# Patient Record
Sex: Female | Born: 1965 | Race: White | Hispanic: No | Smoking: Current some day smoker
Health system: Southern US, Community
[De-identification: ages and names within clinical notes are randomized; demographics above are authoritative.]

## PROBLEM LIST (undated history)

## (undated) DIAGNOSIS — R51 Headache: Secondary | ICD-10-CM

## (undated) DIAGNOSIS — K5909 Other constipation: Secondary | ICD-10-CM

## (undated) DIAGNOSIS — I10 Essential (primary) hypertension: Secondary | ICD-10-CM

## (undated) DIAGNOSIS — Z8709 Personal history of other diseases of the respiratory system: Secondary | ICD-10-CM

## (undated) DIAGNOSIS — K219 Gastro-esophageal reflux disease without esophagitis: Secondary | ICD-10-CM

## (undated) DIAGNOSIS — F32A Depression, unspecified: Secondary | ICD-10-CM

## (undated) DIAGNOSIS — M549 Dorsalgia, unspecified: Secondary | ICD-10-CM

## (undated) DIAGNOSIS — M2550 Pain in unspecified joint: Secondary | ICD-10-CM

## (undated) DIAGNOSIS — Z860100 Personal history of colon polyps, unspecified: Secondary | ICD-10-CM

## (undated) DIAGNOSIS — E785 Hyperlipidemia, unspecified: Secondary | ICD-10-CM

## (undated) DIAGNOSIS — M2540 Effusion, unspecified joint: Secondary | ICD-10-CM

## (undated) DIAGNOSIS — J45909 Unspecified asthma, uncomplicated: Secondary | ICD-10-CM

## (undated) DIAGNOSIS — J189 Pneumonia, unspecified organism: Secondary | ICD-10-CM

## (undated) DIAGNOSIS — Z9889 Other specified postprocedural states: Secondary | ICD-10-CM

## (undated) DIAGNOSIS — F329 Major depressive disorder, single episode, unspecified: Secondary | ICD-10-CM

## (undated) DIAGNOSIS — R112 Nausea with vomiting, unspecified: Secondary | ICD-10-CM

## (undated) DIAGNOSIS — R350 Frequency of micturition: Secondary | ICD-10-CM

## (undated) DIAGNOSIS — H04123 Dry eye syndrome of bilateral lacrimal glands: Secondary | ICD-10-CM

## (undated) DIAGNOSIS — J439 Emphysema, unspecified: Secondary | ICD-10-CM

## (undated) DIAGNOSIS — K429 Umbilical hernia without obstruction or gangrene: Secondary | ICD-10-CM

## (undated) DIAGNOSIS — J449 Chronic obstructive pulmonary disease, unspecified: Secondary | ICD-10-CM

## (undated) DIAGNOSIS — F419 Anxiety disorder, unspecified: Secondary | ICD-10-CM

## (undated) DIAGNOSIS — M1990 Unspecified osteoarthritis, unspecified site: Secondary | ICD-10-CM

## (undated) DIAGNOSIS — E039 Hypothyroidism, unspecified: Secondary | ICD-10-CM

## (undated) DIAGNOSIS — G8929 Other chronic pain: Secondary | ICD-10-CM

## (undated) DIAGNOSIS — R0602 Shortness of breath: Secondary | ICD-10-CM

## (undated) DIAGNOSIS — R3915 Urgency of urination: Secondary | ICD-10-CM

## (undated) DIAGNOSIS — H269 Unspecified cataract: Secondary | ICD-10-CM

## (undated) DIAGNOSIS — K5190 Ulcerative colitis, unspecified, without complications: Secondary | ICD-10-CM

## (undated) HISTORY — PX: ESOPHAGOGASTRODUODENOSCOPY: SHX1529

## (undated) HISTORY — PX: CHOLECYSTECTOMY: SHX55

## (undated) HISTORY — PX: BACK SURGERY: SHX140

## (undated) HISTORY — PX: TUBAL LIGATION: SHX77

## (undated) HISTORY — PX: COLONOSCOPY: SHX174

## (undated) HISTORY — PX: HERNIA REPAIR: SHX51

## (undated) HISTORY — PX: NECK SURGERY: SHX720

## (undated) HISTORY — PX: CARPAL TUNNEL RELEASE: SHX101

## (undated) HISTORY — PX: ABDOMINAL HYSTERECTOMY: SHX81

---

## 1998-09-04 ENCOUNTER — Other Ambulatory Visit: Admission: RE | Admit: 1998-09-04 | Discharge: 1998-09-04 | Payer: Self-pay | Admitting: Family Medicine

## 1999-07-08 ENCOUNTER — Other Ambulatory Visit: Admission: RE | Admit: 1999-07-08 | Discharge: 1999-07-08 | Payer: Self-pay | Admitting: Family Medicine

## 2002-06-21 ENCOUNTER — Encounter: Payer: Self-pay | Admitting: Neurosurgery

## 2002-06-26 ENCOUNTER — Ambulatory Visit (HOSPITAL_COMMUNITY): Admission: RE | Admit: 2002-06-26 | Discharge: 2002-06-27 | Payer: Self-pay | Admitting: Neurosurgery

## 2002-06-26 ENCOUNTER — Encounter: Payer: Self-pay | Admitting: Neurosurgery

## 2002-09-12 ENCOUNTER — Encounter: Admission: RE | Admit: 2002-09-12 | Discharge: 2002-09-12 | Payer: Self-pay | Admitting: Neurosurgery

## 2002-09-12 ENCOUNTER — Encounter: Payer: Self-pay | Admitting: Neurosurgery

## 2004-03-10 ENCOUNTER — Ambulatory Visit (HOSPITAL_COMMUNITY): Admission: RE | Admit: 2004-03-10 | Discharge: 2004-03-11 | Payer: Self-pay | Admitting: Neurosurgery

## 2004-03-17 ENCOUNTER — Emergency Department (HOSPITAL_COMMUNITY): Admission: EM | Admit: 2004-03-17 | Discharge: 2004-03-17 | Payer: Self-pay | Admitting: Emergency Medicine

## 2004-08-07 ENCOUNTER — Encounter: Admission: RE | Admit: 2004-08-07 | Discharge: 2004-08-07 | Payer: Self-pay | Admitting: Neurosurgery

## 2004-08-22 ENCOUNTER — Encounter: Admission: RE | Admit: 2004-08-22 | Discharge: 2004-08-22 | Payer: Self-pay | Admitting: Neurosurgery

## 2004-09-09 ENCOUNTER — Encounter: Admission: RE | Admit: 2004-09-09 | Discharge: 2004-09-09 | Payer: Self-pay | Admitting: Neurosurgery

## 2004-09-30 ENCOUNTER — Encounter: Admission: RE | Admit: 2004-09-30 | Discharge: 2004-09-30 | Payer: Self-pay | Admitting: Neurosurgery

## 2004-11-19 ENCOUNTER — Encounter: Admission: RE | Admit: 2004-11-19 | Discharge: 2004-11-19 | Payer: Self-pay | Admitting: Neurosurgery

## 2004-12-01 ENCOUNTER — Ambulatory Visit (HOSPITAL_COMMUNITY): Admission: RE | Admit: 2004-12-01 | Discharge: 2004-12-02 | Payer: Self-pay | Admitting: Neurosurgery

## 2007-10-18 ENCOUNTER — Encounter: Admission: RE | Admit: 2007-10-18 | Discharge: 2007-10-18 | Payer: Self-pay | Admitting: Neurosurgery

## 2008-06-26 ENCOUNTER — Inpatient Hospital Stay (HOSPITAL_COMMUNITY): Admission: RE | Admit: 2008-06-26 | Discharge: 2008-07-01 | Payer: Self-pay | Admitting: Neurosurgery

## 2008-08-09 ENCOUNTER — Encounter: Admission: RE | Admit: 2008-08-09 | Discharge: 2008-08-09 | Payer: Self-pay | Admitting: Neurosurgery

## 2008-11-08 ENCOUNTER — Encounter: Admission: RE | Admit: 2008-11-08 | Discharge: 2008-11-08 | Payer: Self-pay | Admitting: Neurosurgery

## 2010-07-02 LAB — COMPREHENSIVE METABOLIC PANEL
ALT: 13 U/L (ref 0–35)
AST: 20 U/L (ref 0–37)
Albumin: 3.4 g/dL — ABNORMAL LOW (ref 3.5–5.2)
Alkaline Phosphatase: 91 U/L (ref 39–117)
BUN: 9 mg/dL (ref 6–23)
CO2: 25 mEq/L (ref 19–32)
Calcium: 8.9 mg/dL (ref 8.4–10.5)
Chloride: 108 mEq/L (ref 96–112)
Creatinine, Ser: 0.74 mg/dL (ref 0.4–1.2)
GFR calc Af Amer: >60 mL/min (ref >60)
GFR calc non Af Amer: >60 mL/min (ref >60)
Glucose, Bld: 84 mg/dL (ref 70–99)
Potassium: 4.3 mEq/L (ref 3.5–5.1)
Sodium: 138 mEq/L (ref 135–145)
Total Bilirubin: 0.9 mg/dL (ref 0.3–1.2)
Total Protein: 5.6 g/dL — ABNORMAL LOW (ref 6.0–8.3)

## 2010-07-02 LAB — CBC
HCT: 38 % (ref 36.0–46.0)
Hemoglobin: 13 g/dL (ref 12.0–15.0)
MCHC: 34.2 g/dL (ref 30.0–36.0)
MCV: 93.1 fL (ref 78.0–100.0)
Platelets: 186 10*3/uL (ref 150–400)
RBC: 4.08 MIL/uL (ref 3.87–5.11)
RDW: 13.5 % (ref 11.5–15.5)
WBC: 8.8 10*3/uL (ref 4.0–10.5)

## 2010-07-02 LAB — DIFFERENTIAL
Lymphocytes Relative: 15 % (ref 12–46)
Lymphs Abs: 1.3 10*3/uL (ref 0.7–4.0)
Monocytes Absolute: 0.4 10*3/uL (ref 0.1–1.0)
Monocytes Relative: 5 % (ref 3–12)
Neutro Abs: 7 10*3/uL (ref 1.7–7.7)
Neutrophils Relative %: 79 % — ABNORMAL HIGH (ref 43–77)

## 2010-07-02 LAB — TYPE AND SCREEN: Antibody Screen: POSITIVE

## 2010-08-05 NOTE — Op Note (Signed)
[name], [name]               ACCOUNT NO.:  [id number]   MEDICAL RECORD NO.:  [id number]          PATIENT TYPE:  INP   LOCATION:  3005                         FACILITY:  [facility]   PHYSICIAN:  Kathaleen Maser. Pool, M.D.    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   POSTOPERATIVE DIAGNOSIS:  L5-S1 degenerative disk  disease/postlaminectomy spondylolisthesis.   PROCEDURE NAME:  Reexploration of L5-S1 laminotomy with bilateral redo  L5-S1 decompressive laminectomies and foraminotomies, more than would be  required for simple interbody fusion alone.  L5-S1 posterior lumbar  interbody fusion utilizing tangent interbody allograft wedge, Telamon  interbody PEEK cage and local autografting.  L5-S1 posterolateral  arthrodesis utilizing non-segmental pedicle screw fixation and local  autografting.   SURGEON:  Kathaleen Maser. Pool, MD   ASSISTANT:  Reinaldo Meeker, MD   ANESTHESIA:  General endotracheal.   INDICATIONS:  [name] is a 45 year old female with history of  previous left-sided L5-S1 laminotomy and diskectomy x2.  The patient  presents with intractable back pain and lower extremity symptoms failing  conservative management.  Workup demonstrates evidence of  postlaminectomy spondylolisthesis with marked disk space collapse at L5-  S1.  She has severe foraminal stenosis.  The patient has been counseled  as to her options.  She decided to proceed with L5-S1 decompression and  fusion with instrumentation in the hopes of improving her symptoms.   OPERATIVE NOTE:  The patient was brought to the operative room and  placed on operating table in the supine position.  After adequate level  of anesthesia was achieved, the patient was positioned prone onto a  Wilson frame, appropriately padded the patient's lumbar  regions, prepped  and draped sterilely.  A #10 blade was used to make a curvilinear skin  incision overlying the L4-5 and S1 levels.  This was carried sharply in  the midline.  A subperiosteal dissection was then performed exposing the  lamina and facet joints of L4-5 and S1 bilaterally as well as transverse  processes of L5 and sacral ala bilaterally.  Deep self-retaining  retractor was placed.  Intraoperative fluoroscopy was used and levels  were confirmed.  Previous laminotomy site on the left-sided L5-S1 was  dissected free and epidural scar was resected.  Complete laminectomy  then performed at L5 using Leksell rongeurs and Kerrison rongeurs to  remove the entire lamina of L5.  Inferior facetectomies of L5 were  performed bilaterally.  Superior facetectomies of S1 were performed  bilaterally.  Superior laminectomy of S1 was performed bilaterally.  All  bone was cleaned and used in the later autografting.  Epidural scar and  ligament flavum were elevated resected in the piecemeal fashion.  Wide  decompressive foraminotomies were performed along the course of exiting  L5 and S1 nerve roots bilaterally.  Epidural venous plexus was  coagulated and cut.  Bilateral diskectomies were then performed at L5-  S1.  Disk space was then distracted up to 8  mm, then an 8-mm distractor  left on the patient's right side.  Thecal sac and nerve root were  inspected on the left side.  Disk space was then reamed and cut with 8-  mm tangent instrument.  Soft tissue was removed from the interspace.  An  8 x 26 mm tangent wedge was then packed into place and recessed  approximately 2 mm from the posterior cortical margin.  Distractors were  removed from the patient's right side.  Thecal sac and nerve root were  inspected on the right side.  Disk space once again was reamed and then  cut with 8-mm tangent instrument.  Soft tissues were then further  removed from the interspace.  Disk space was curettaged.   Morselized  autograft was packed for later fusion.  An 8 x 22 mm Telamon cage was  packed with morselized autograft, then packed into place and recessed  approximately 2 mm from the posterior cortical margin of L5.  Pedicles  of L5 and S1 were then identified using surface landmarks and  intraoperative fluoroscopy.  Superficial bone overlying the pedicle was  then removed using the high-speed drill.  Each pedicle was then probed  using pedicle awl.  The pedicle awl track was then tapped with a 5.25-mm  screw.  Each screw tap hole was then probed and found to be solid with  the bone.  A 5.75 x 45 mm radius screw was placed bilaterally at L5.  A  6.75 x 40 mm screw was placed bilaterally at S1.  Transverse processes  and sacral ala were then decorticated using high speed drill.  Morselized autograft was packed inferolaterally for later fusion.  Short  segment titanium rods were then placed over the screw heads at L5 and  S1.  Locking caps were placed over the screw head and locking caps were  then engaged with construct under pressure.  Final images revealed good  position of bone grafts, hardware at appropriate operative level, and  normal spine.  Wound was then irrigated with antibiotic solution.  Gelfoam was placed topically for hemostasis and found to be good.  A  medium Hemovac drain was left in the interspace.  The wounds were then  closed in layers with Vicryl suture.  Steri-Strips and sterile dressing  were applied.  There were no complications.  The patient tolerated the  procedure well and she returned to the recovery room postoperatively.           ______________________________  Kathaleen Maser Pool, M.D.     HAP/MEDQ  D:  06/26/2008  T:  06/27/2008  Job:  [id number]

## 2010-08-05 NOTE — Discharge Summary (Signed)
[name], [name]               ACCOUNT NO.:  [id number]   MEDICAL RECORD NO.:  [id number]          PATIENT TYPE:  INP   LOCATION:  3005                         FACILITY:  [facility]   PHYSICIAN:  Kathaleen Maser. Pool, M.D.    DATE OF BIRTH:  12/04/1965   DATE OF ADMISSION:  06/26/2008  DATE OF DISCHARGE:  07/01/2008                               DISCHARGE SUMMARY   FINAL DIAGNOSIS:  L5-S1 degenerative disk disease with stenosis.   HISTORY OF PRESENT ILLNESS:  [name] is a 45 year old female, status  post previous L5-S1 laminectomy and diskectomy x2.  The patient presents  now with symptoms of lumbar instability and chronic pain.  The patient  presents now for lumbar decompression and fusion at the L5-S1 level in  hopes of improving her symptoms.   HOSPITAL COURSE:  The patient was taken to the operating room and  uncomplicated L5-S1 decompression and fusion with instrumentation was  performed.  Postoperatively, the patient had a great deal of difficulty  with incisional discomfort.  Preoperative radicular pain was improved.  She was gradually mobilized with physical and occupational therapy.  Her  overall pain level gradually diminished.  At the time of discharge, the  patient was tolerating a regular diet.  She is up and ambulatory with  minimal assistance.  Her wound was healing well.  Plan is for her to be  discharged home.  Discharge medications are unchanged from preop.   DISCHARGE DISPOSITION:  The patient will follow up in my office in 1  week.           ______________________________  Kathaleen Maser. Pool, M.D.     HAP/MEDQ  D:  08/07/2008  T:  08/08/2008  Job:  [id number]

## 2010-08-08 NOTE — Op Note (Signed)
NAMEQUETZALLY, [name]               ACCOUNT NO.:  [id number]   MEDICAL RECORD NO.:  [id number]          PATIENT TYPE:  AMB   LOCATION:  SDS                          FACILITY:  [facility]   PHYSICIAN:  Henry A. Pool, M.D.    DATE OF BIRTH:  03-Aug-1965   DATE OF PROCEDURE:  12/01/2004  DATE OF DISCHARGE:                                 OPERATIVE REPORT   PREOPERATIVE DIAGNOSES:  Left L5-S1 recurrent herniated nucleus pulposus and  radiculopathy.   POSTOPERATIVE DIAGNOSES:  Left L5-S1 recurrent herniated nucleus pulposus  and radiculopathy.   OPERATION PERFORMED:  Left L5-S1 redo laminotomy and microdiskectomy.   SURGEON:  Kathaleen Maser. Pool, M.D.   ASSISTANT:  Donalee Citrin, M.D.   ANESTHESIA:  General endotracheal.   INDICATIONS FOR PROCEDURE:  [name] is a 45 year old female status post  previous left-sided L5-S1 laminotomy and microdiskectomy.  The patient  presents now with recurrent left S1 radicular pain failing conservative  management.  Work-up demonstrates evidence of a probable left L5-S1  recurrent disk herniation with compression of the left-sided S1 nerve root.   DESCRIPTION OF PROCEDURE:  The patient was taken to the operating room and  placed on the table in the supine position.  After adequate level of  anesthesia was achieved, the patient was positioned prone onto a Wilson  frame and appropriately padded.  The patient's lumbar region was prepped and  draped sterilely.  A 10 blade was used to make a linear skin incision  overlying the L5-S1 interspace.  This was carried down sharply in the  midline.  A subperiosteal dissection was then performed exposing the lamina  and facet joints of L5 and S1 on the left.  Deep self-retaining retractor  was placed.  Intraoperative x-ray was taken, the level was confirmed.  The  patient's previous laminotomy site was then dissected free using dental  instruments.  The laminotomy was widened slightly using Kerrison rongeurs.  Epidural  scar was dissected free.  The left S1 nerve root was identified  just medial to the S1 pedicle.  Microscope was brought into the field and  used for microdissection of the left-sided S1 nerve root and disk  herniation.  The S1 nerve root was then mobilized toward the midline.  The  disk herniation was readily apparent.  It was then dissected free using  dental instruments.  It was then removed in piecemeal fashion using  pituitary rongeurs.  The disk space was then entered and loose fragments of  disk material were then removed from the interspace.  At this point a very  thorough diskectomy had been achieved.  There was no evidence of injury to  thecal sac or nerve roots. There was no evidence of any residual compression  to the thecal sac or nerve roots.  The wound was then irrigated with  antibiotic solution.  Gelfoam was placed topically for hemostasis which was  found to be good.  Microscope and retractor system were removed.  Hemostasis was achieved with  electrocautery.  The wound was then closed in layers with Vicryl sutures.  Steri-Strips and sterile dressing  were applied.  There were no apparent  complications.  The patient tolerated the procedure well and returned to the  recovery room postoperatively.           ______________________________  Kathaleen Maser Pool, M.D.     HAP/MEDQ  D:  12/01/2004  T:  12/02/2004  Job:  [id number]

## 2010-08-08 NOTE — Op Note (Signed)
NAME:  [name], [name]                         ACCOUNT NO.:  [id number]   MEDICAL RECORD NO.:  [id number]                   PATIENT TYPE:  OIB   LOCATION:  3008                                 FACILITY:  [facility]   PHYSICIAN:  Kathaleen Maser. Pool, M.D.                 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   OPERATION PERFORMED:  C5-6 anterior cervical diskectomy with allograft and  anterior plating.   SURGEON:  Kathaleen Maser. Pool, M.D.   ASSISTANT:  Reinaldo Meeker, M.D.   ANESTHESIA:  General endotracheal.   INDICATIONS FOR PROCEDURE:  The patient is a 45 year old female with history  of neck and right upper extremity symptoms, consistent with a right-sided C6  radiculopathy which has failed conservative management.  MRI scan  demonstrates evidence of very significant spondylosis off to the right side  at C5-6 with marked compression of the right-sided C6 nerve root at its  enters the neural foramen.  The patient has been counseled as to her  options.  She decided to proceed with a C5-6 anterior cervical diskectomy  and fusion with allograft and anterior plating for hopeful improvement of  her symptoms.   DESCRIPTION OF PROCEDURE:  The patient was taken to the operating room and  placed on the table in supine position.  After adequate level of general  anesthesia was achieved, the patient was positioned with the neck slightly  extended and held in place with halter traction.  The patient's anterior  cervical region was prepped and draped sterilely.  A 10 blade was used to  make a linear skin incision overlying the C5-6 interspace.  This was carried  down sharply to the platysma.  The platysma was then divided vertically and  dissection proceeded along the  medial border of the sternocleidomastoid  muscle and carotid sheath.  Trachea and esophagus were mobilized and  retracted toward the left.  Prevertebral fascia was stripped off the  anterior spinal column.  The longus colli muscles were then elevated  bilaterally using electrocautery.  Deep self-retaining retractor was placed.  Intraoperative fluoroscopy was used and the C5-6 level were confirmed.  The  disk spaces was then incised with a 15 blade in rectangular fashion.  A wide  disk space clean out was then achieved using pituitary rongeurs, forward and  backward angled Carlens curets, Kerrison rongeurs and a high speed drill.  All elements of the disk were removed down to the posterior annulus at both  levels.  Microscope was brought into the field and used throughout the  remainder of the diskectomy.  Remaining aspects of the annulus and  osteophytes were removed using high speed drill down to the level of  the  posterior longitudinal ligament.  The posterior longitudinal ligament was  then elevated and resected in piecemeal fashion using Kerrison rongeurs.  The underlying thecal sac was identified.  A wide central decompression was  then performed by using Kerrison rongeurs to undercut the bodies of C5 and  C6.  Decompression proceeded out to the left-sided C6 foramen.  C6 nerve  root was identified proximally.  There was no evidence of any compression.  Decompression then proceeded out to the right-sided C6 foramen.  The C6  nerve root was once again identified proximally.  A wide anterior  foraminotomy was then performed using Kerrison rongeurs along the course of  the exiting nerve root.  There was no residual stenosis remaining.  A blunt  probe passed easily along the course of the nerve root.  There was no  evidence of injury to the thecal sac or nerve roots.  The wound was then  irrigated with antibiotic solution.  Gelfoam was placed topically for  hemostasis.  A 6 mm patellar  wedge allograft was then impacted into place  and stretched approximately 1 mm from the anterior cortical surface.  A 23  mm Atlantis anterior cervical plate was then placed over the C5 and C6  levels.  This was then attached under fluoroscopic guidance using 13 mm  variable screws, two each at C5 and C6.  All four screws given a final  tightening and found to be solidly within bone.  Locking screws engaged at  both levels.  Final images revealed good position of bone grafts and  hardware at the proper operative level with normal alignment of the spine.  The wound was then irrigated with antibiotic solution.  Inspected for  hemostasis which was found to be good.  The wound was then closed in typical  fashion.  Steri-Strips and sterile dressing were applied.  There were no  apparent complications.  The patient tolerated the procedure well and  returned to the recovery room postoperatively.                                                 Henry A. Pool, M.D.    HAP/MEDQ  D:  06/26/2002  T:  06/26/2002  Job:  [id number]

## 2010-08-08 NOTE — Op Note (Signed)
NAMEKANIKA, BUNGERT               ACCOUNT NO.:  [id number]   MEDICAL RECORD NO.:  [id number]          PATIENT TYPE:  OIB   LOCATION:  2899                         FACILITY:  [facility]   PHYSICIAN:  Kathaleen Maser. Pool, M.D.    DATE OF BIRTH:  November 28, 1965   DATE OF PROCEDURE:  03/10/2004  DATE OF DISCHARGE:                                 OPERATIVE REPORT   SURGEON:  Sherilyn Cooter A. Pool, M.D.   ASSISTANT:  Nurse.   PREOPERATIVE DIAGNOSIS:  Left L5-S1 herniated nucleus pulposus with  radiculopathy.   POSTOPERATIVE DIAGNOSIS:  Left L5-S1 herniated nucleus pulposus with  radiculopathy.   PROCEDURE:  Left L5-S1 laminectomy and microdiskectomy.   SURGEON:  Kathaleen Maser. Pool, M.D.   ASSISTANT:  Izell Henagar. Elesa Hacker, M.D.   ANESTHESIA:  General endotracheal.   INDICATIONS:  Ms. Gin is a 45 year old female with a history of back and  left lower extremity pain, paresthesias and weakness with left-sided L5-S1  radiculopathy.  Patient has failed conservative management.  Workup and x-  rays show evidence of an left-sided L5-S1 disk herniation with a superior  fragment causing compression in the left-sided L5-S1 nerve roots.  Patient  presents now for laminectomy and microdiskectomy in hopes of improving her  symptoms.   OPERATIVE NOTE:  Patient was brought to the operating room and placed in a  supine position.  The appropriate level of anesthesia was achieved.  Patient  was turned prone onto the Wilson frame for better __________.  Prepped and  draped sterilely.  A 10 blade __________ incision overlying the L5-S1  interspace.  This carried down sharply in the midline.  A subperiosteal  dissection had been performed on the left side exposing the lamina and facet  joints of L5-S1.  A deep self-retaining retractor was placed.  X-ray was  taken.  The level was confirmed.  The laminectomy was then performed using a  high-speed drill and Kerrison rongeurs at the inferior aspect of the lamina  of L5 and the  medial aspect of the L5-S1 facet joint and superior rim of the  S1 lam.  The ligamentum flavum was then elevated and resected in same  fashion using Kerrison rongeurs around the thecal sac.  Next, the L5-S1  nerve roots were identified.  The microscope was brought into these for  microdiskectomy of the afore-mentioned nerve root with underlying disk  herniation.  Epidural area around this venous plexus coagulated and cut.  Thecal sac and S1 nerve root were mobilized, turned towards the midline.  Disk space and superior disk herniation were apparent.  These were then  incised with a 15 blade, __________ were fractured to avoid disc space.  This was shaped using pituitary rongeurs, operating pituitary rongeurs and  Epstein curettes.  All of the disk herniation was completely resected,  including the superior free fragment.  All loose or the sharp disk material  was removed from the interspace after a very thorough diskectomy had been  performed.  The spinal canal was inspected.  It was found to be free of any  residual compression.  The wound is  then irrigated, __________.  Gelfoam is  placed postoperatively for hemostasis,  found to be good in the operating microscope.  Retractors were removed.  Hemostasis was achieved with electrocautery.  The wound was closed in layers  with Vicryl sutures.  Steri-Strip and sterile dressings were applied.  There  were no known complications.  Patient was awakened and she returned  __________.      HAP/MEDQ  D:  03/10/2004  T:  03/10/2004  Job:  [id number]

## 2011-07-16 ENCOUNTER — Encounter: Payer: Self-pay | Admitting: Physical Medicine & Rehabilitation

## 2011-08-10 ENCOUNTER — Ambulatory Visit: Payer: Medicare Other | Admitting: Physical Medicine & Rehabilitation

## 2011-08-10 ENCOUNTER — Encounter: Payer: Medicare Other | Attending: Physical Medicine & Rehabilitation

## 2011-09-09 ENCOUNTER — Other Ambulatory Visit: Payer: Self-pay | Admitting: Neurosurgery

## 2011-09-09 DIAGNOSIS — M549 Dorsalgia, unspecified: Secondary | ICD-10-CM

## 2011-09-22 ENCOUNTER — Ambulatory Visit
Admission: RE | Admit: 2011-09-22 | Discharge: 2011-09-22 | Disposition: A | Discharge status: Home / Self Care | Payer: Medicare Other | Source: Ambulatory Visit | Attending: Neurosurgery | Admitting: Neurosurgery

## 2011-09-22 VITALS — BP 118/53 | HR 57 | Ht 65.0 in | Wt 140.0 lb

## 2011-09-22 DIAGNOSIS — M549 Dorsalgia, unspecified: Secondary | ICD-10-CM

## 2011-09-22 MED ORDER — DIAZEPAM 5 MG PO TABS
10.0000 mg | ORAL_TABLET | Freq: Once | ORAL | Status: AC
  Administered 2011-09-22: 10 mg via ORAL

## 2011-09-22 MED ORDER — ONDANSETRON HCL 4 MG/2ML IJ SOLN: 4.0000 mg | Freq: Four times a day (QID) | INTRAMUSCULAR | Status: DC | PRN

## 2011-09-22 MED ORDER — MEPERIDINE HCL 100 MG/ML IJ SOLN
75.0000 mg | Freq: Once | INTRAMUSCULAR | Status: AC
  Administered 2011-09-22: 75 mg via INTRAMUSCULAR

## 2011-09-22 MED ORDER — IOHEXOL 180 MG/ML  SOLN
15.0000 mL | Freq: Once | INTRAMUSCULAR | Status: AC | PRN
  Administered 2011-09-22: 15 mL via INTRATHECAL

## 2011-09-22 MED ORDER — ONDANSETRON HCL 4 MG/2ML IJ SOLN
4.0000 mg | Freq: Once | INTRAMUSCULAR | Status: AC
  Administered 2011-09-22: 4 mg via INTRAMUSCULAR

## 2011-09-22 MED ORDER — OXYCODONE-ACETAMINOPHEN 5-325 MG PO TABS
2.0000 | ORAL_TABLET | Freq: Once | ORAL | Status: AC
  Administered 2011-09-22: 2 via ORAL

## 2011-09-22 NOTE — Discharge Instructions (Signed)

## 2011-10-15 ENCOUNTER — Other Ambulatory Visit: Payer: Self-pay | Admitting: Neurosurgery

## 2011-10-15 ENCOUNTER — Encounter (HOSPITAL_COMMUNITY): Payer: Self-pay | Admitting: Pharmacy Technician

## 2011-10-27 ENCOUNTER — Encounter (HOSPITAL_COMMUNITY): Payer: Self-pay | Admitting: Pharmacy Technician

## 2011-10-27 ENCOUNTER — Encounter (HOSPITAL_COMMUNITY): Payer: Self-pay

## 2011-10-27 ENCOUNTER — Encounter (HOSPITAL_COMMUNITY)
Admission: RE | Admit: 2011-10-27 | Discharge: 2011-10-27 | Disposition: A | Discharge status: Home / Self Care | Payer: BC Managed Care – PPO | Source: Ambulatory Visit | Attending: Neurosurgery | Admitting: Neurosurgery

## 2011-10-27 DIAGNOSIS — Z01818 Encounter for other preprocedural examination: Secondary | ICD-10-CM | POA: Insufficient documentation

## 2011-10-27 DIAGNOSIS — Z01812 Encounter for preprocedural laboratory examination: Secondary | ICD-10-CM | POA: Insufficient documentation

## 2011-10-27 DIAGNOSIS — Z01810 Encounter for preprocedural cardiovascular examination: Secondary | ICD-10-CM | POA: Insufficient documentation

## 2011-10-27 HISTORY — DX: Effusion, unspecified joint: M25.40

## 2011-10-27 HISTORY — DX: Unspecified cataract: H26.9

## 2011-10-27 HISTORY — DX: Urgency of urination: R39.15

## 2011-10-27 HISTORY — DX: Essential (primary) hypertension: I10

## 2011-10-27 HISTORY — DX: Headache: R51

## 2011-10-27 HISTORY — DX: Chronic obstructive pulmonary disease, unspecified: J44.9

## 2011-10-27 HISTORY — DX: Personal history of colonic polyps: Z86.010

## 2011-10-27 HISTORY — DX: Unspecified osteoarthritis, unspecified site: M19.90

## 2011-10-27 HISTORY — DX: Pain in unspecified joint: M25.50

## 2011-10-27 HISTORY — DX: Other chronic pain: G89.29

## 2011-10-27 HISTORY — DX: Hyperlipidemia, unspecified: E78.5

## 2011-10-27 HISTORY — DX: Other specified postprocedural states: Z98.890

## 2011-10-27 HISTORY — DX: Ulcerative colitis, unspecified, without complications: K51.90

## 2011-10-27 HISTORY — DX: Dorsalgia, unspecified: M54.9

## 2011-10-27 HISTORY — DX: Emphysema, unspecified: J43.9

## 2011-10-27 HISTORY — DX: Depression, unspecified: F32.A

## 2011-10-27 HISTORY — DX: Hypothyroidism, unspecified: E03.9

## 2011-10-27 HISTORY — DX: Pneumonia, unspecified organism: J18.9

## 2011-10-27 HISTORY — DX: Major depressive disorder, single episode, unspecified: F32.9

## 2011-10-27 HISTORY — DX: Shortness of breath: R06.02

## 2011-10-27 HISTORY — DX: Anxiety disorder, unspecified: F41.9

## 2011-10-27 HISTORY — DX: Personal history of other diseases of the respiratory system: Z87.09

## 2011-10-27 HISTORY — DX: Frequency of micturition: R35.0

## 2011-10-27 HISTORY — DX: Dry eye syndrome of bilateral lacrimal glands: H04.123

## 2011-10-27 HISTORY — DX: Other specified postprocedural states: R11.2

## 2011-10-27 HISTORY — DX: Gastro-esophageal reflux disease without esophagitis: K21.9

## 2011-10-27 HISTORY — DX: Other constipation: K59.09

## 2011-10-27 HISTORY — DX: Personal history of colon polyps, unspecified: Z86.0100

## 2011-10-27 HISTORY — DX: Unspecified asthma, uncomplicated: J45.909

## 2011-10-27 LAB — SURGICAL PCR SCREEN: MRSA, PCR: NEGATIVE

## 2011-10-27 LAB — CBC WITH DIFFERENTIAL/PLATELET
Basophils Relative: 1 % (ref 0–1)
HCT: 38.3 % (ref 36.0–46.0)
Hemoglobin: 12.6 g/dL (ref 12.0–15.0)
Lymphs Abs: 1.9 10*3/uL (ref 0.7–4.0)
MCH: 30.7 pg (ref 26.0–34.0)
MCHC: 32.9 g/dL (ref 30.0–36.0)
Monocytes Absolute: 0.4 10*3/uL (ref 0.1–1.0)
Monocytes Relative: 7 % (ref 3–12)
Neutro Abs: 3.5 10*3/uL (ref 1.7–7.7)
Neutrophils Relative %: 58 % (ref 43–77)
RBC: 4.11 MIL/uL (ref 3.87–5.11)

## 2011-10-27 LAB — BASIC METABOLIC PANEL
CO2: 26 mEq/L (ref 19–32)
Calcium: 8.9 mg/dL (ref 8.4–10.5)
Chloride: 107 mEq/L (ref 96–112)
Creatinine, Ser: 0.89 mg/dL (ref 0.50–1.10)
Glucose, Bld: 81 mg/dL (ref 70–99)

## 2011-10-27 NOTE — Pre-Procedure Instructions (Signed)
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  Call this number if you have problems the morning of surgery: [phone]   Remember:   Do not eat food:After Midnight.  Take these medicines the morning of surgery with A SIP OF WATER: Pain Pill(if needed),Metoprolol(Lopressor),Eye Drops(if needed),Synthroid((Levothyroxine),Esomeprazole(Nexium),Diazepam(Valium),and Albuterol(if needed)<Bring Your Inhaler With You> ,  Do not wear jewelry, make-up or nail polish.  Do not wear lotions, powders, or perfumes.   Do not bring valuables to the hospital.  Contacts, dentures or bridgework may not be worn into surgery.  Leave suitcase in the car. After surgery it may be brought to your room.  For patients admitted to the hospital, checkout time is 11:00 AM the day of discharge.   Patients discharged the day of surgery will not be allowed to drive home.   Special Instructions: CHG Shower Use Special Wash: 1/2 bottle night before surgery and 1/2 bottle morning of surgery.   Please read over the following fact sheets that you were given: Pain Booklet, Coughing and Deep Breathing, Blood Transfusion Information, MRSA Information and Surgical Site Infection Prevention

## 2011-10-27 NOTE — Progress Notes (Signed)
Called and left message for Erie Noe that blood bank called and pt has antibodies-could Dr.Pool put an order in to have 2 units available for surgery

## 2011-10-27 NOTE — Progress Notes (Signed)
Pt doesn't have a  Cardiologist  Denies ever having an echo/stress test/heart cath  Pt goes to a clinic in [place] and sees a PA Joyice Faster  Denies ekg/cxr <1

## 2011-10-28 ENCOUNTER — Encounter (HOSPITAL_COMMUNITY): Payer: Self-pay | Admitting: Vascular Surgery

## 2011-10-28 NOTE — Consult Note (Signed)
Anesthesia Chart Review:  Patient is a 46 year old female scheduled for L4-5 degenerative disc disease, PLIF on 10/30/11 by Dr. Jordan Likes.  History includes smoking, post-operative N/V, HTN, COPD/asthma/bronchitis/emphysema, PNA, headaches, ulcerative colitis, cataracts, HLD, GERD, hypothyroidism, anxiety, depression, prior back and neck surgery.     Labs noted.  PLT 142.  Glucose, Cr, H/H WNL.  T&C done.  CXR on 10/27/11 no acute cardiopulmonary disease.  EKG on 10/27/11 showed SB @ 51 bpm, low voltage QRS, cannot rule out anterior infarct (age undetermined), non-specific ST/T wave changes.  Overall, I think her EKG is stable since at least December 2005.    Anticipate she can proceed if no acute cardiopulmonary symptoms.  Shonna Chock, PA-C

## 2011-10-29 MED ORDER — CEFAZOLIN SODIUM-DEXTROSE 2-3 GM-% IV SOLR: 2.0000 g | INTRAVENOUS | Status: DC

## 2011-10-29 MED ORDER — DEXAMETHASONE SODIUM PHOSPHATE 10 MG/ML IJ SOLN: 10.0000 mg | INTRAMUSCULAR | Status: DC

## 2011-10-30 ENCOUNTER — Inpatient Hospital Stay (HOSPITAL_COMMUNITY): Admission: RE | Admit: 2011-10-30 | Payer: Medicare Other | Source: Ambulatory Visit | Admitting: Neurosurgery

## 2011-10-30 ENCOUNTER — Encounter (HOSPITAL_COMMUNITY): Admission: RE | Payer: Self-pay | Source: Ambulatory Visit

## 2011-10-30 SURGERY — POSTERIOR LUMBAR FUSION 1 LEVEL
Anesthesia: General | Site: Back

## 2011-11-01 LAB — TYPE AND SCREEN
Antibody Screen: POSITIVE
DAT, IgG: NEGATIVE
Donor AG Type: NEGATIVE
Unit division: 0

## 2011-11-10 ENCOUNTER — Other Ambulatory Visit: Payer: Self-pay | Admitting: Neurosurgery

## 2011-11-10 DIAGNOSIS — M545 Low back pain: Secondary | ICD-10-CM

## 2011-11-13 ENCOUNTER — Ambulatory Visit
Admission: RE | Admit: 2011-11-13 | Discharge: 2011-11-13 | Disposition: A | Discharge status: Home / Self Care | Payer: Medicare Other | Source: Ambulatory Visit | Attending: Neurosurgery | Admitting: Neurosurgery

## 2011-11-13 DIAGNOSIS — M545 Low back pain: Secondary | ICD-10-CM

## 2011-11-13 MED ORDER — METHYLPREDNISOLONE ACETATE 40 MG/ML INJ SUSP (RADIOLOG
120.0000 mg | Freq: Once | INTRAMUSCULAR | Status: AC
  Administered 2011-11-13: 120 mg via EPIDURAL

## 2011-11-13 MED ORDER — IOHEXOL 180 MG/ML  SOLN
1.0000 mL | Freq: Once | INTRAMUSCULAR | Status: AC | PRN
  Administered 2011-11-13: 1 mL via EPIDURAL

## 2012-04-13 ENCOUNTER — Other Ambulatory Visit: Payer: Self-pay | Admitting: Neurosurgery

## 2012-04-18 ENCOUNTER — Encounter (HOSPITAL_COMMUNITY): Payer: Self-pay | Admitting: Pharmacist

## 2012-04-20 ENCOUNTER — Encounter (HOSPITAL_COMMUNITY): Admission: RE | Admit: 2012-04-20 | Payer: Medicare Other | Source: Ambulatory Visit

## 2012-04-20 ENCOUNTER — Encounter (HOSPITAL_COMMUNITY)
Admission: RE | Admit: 2012-04-20 | Discharge: 2012-04-20 | Disposition: A | Discharge status: Home / Self Care | Payer: Medicare Other | Source: Ambulatory Visit | Attending: Neurosurgery | Admitting: Neurosurgery

## 2012-04-20 ENCOUNTER — Encounter (HOSPITAL_COMMUNITY): Payer: Self-pay

## 2012-04-20 HISTORY — DX: Umbilical hernia without obstruction or gangrene: K42.9

## 2012-04-20 LAB — CBC WITH DIFFERENTIAL/PLATELET
Basophils Relative: 0 % (ref 0–1)
Eosinophils Absolute: 0.2 10*3/uL (ref 0.0–0.7)
HCT: 40.3 % (ref 36.0–46.0)
Hemoglobin: 13.4 g/dL (ref 12.0–15.0)
Lymphs Abs: 1.6 10*3/uL (ref 0.7–4.0)
MCHC: 33.3 g/dL (ref 30.0–36.0)
MCV: 92 fL (ref 78.0–100.0)
Monocytes Absolute: 0.5 10*3/uL (ref 0.1–1.0)
Monocytes Relative: 8 % (ref 3–12)
Neutro Abs: 4.6 10*3/uL (ref 1.7–7.7)
RBC: 4.38 MIL/uL (ref 3.87–5.11)
RDW: 13.9 % (ref 11.5–15.5)

## 2012-04-20 LAB — BASIC METABOLIC PANEL
CO2: 23 mEq/L (ref 19–32)
Calcium: 9 mg/dL (ref 8.4–10.5)
Creatinine, Ser: 0.83 mg/dL (ref 0.50–1.10)
Glucose, Bld: 67 mg/dL — ABNORMAL LOW (ref 70–99)
Sodium: 141 mEq/L (ref 135–145)

## 2012-04-20 LAB — SURGICAL PCR SCREEN: Staphylococcus aureus: NEGATIVE

## 2012-04-20 NOTE — Pre-Procedure Instructions (Addendum)
[name]  04/20/2012   Your procedure is scheduled on: 04/26/12  Report to [facility] [id number] AM.  Call this number if you have problems the morning of surgery: [phone]   Remember:   Do not eat food or drink liquids after midnight.   Take these medicines the morning of surgery with A SIP OF WATER: inhaler, paim med, valium, nexium, duragesic, synthroid, metoprolol STOP goodys powder now            Take all other meds until surgery unless otherwise inst by dr  Drucilla Schmidt not wear jewelry, make-up or nail polish.  Do not wear lotions, powders, or perfumes. You may wear deodorant.  Do not shave 48 hours prior to surgery. Men may shave face and neck.  Do not bring valuables to the hospital.  Contacts, dentures or bridgework may not be worn into surgery.  Leave suitcase in the car. After surgery it may be brought to your room.  For patients admitted to the hospital, checkout time is 11:00 AM the day of  discharge.   Patients discharged the day of surgery will not be allowed to drive  home.  Name and phone number of your driver: bill havens [phone]  Special Instructions: Shower using CHG 2 nights before surgery and the night before surgery.  If you shower the day of surgery use CHG.  Use special wash - you have one bottle of CHG for all showers.  You should use approximately 1/3 of the bottle for each shower.   Please read over the following fact sheets that you were given: Pain Booklet, Coughing and Deep Breathing, Blood Transfusion Information, MRSA Information and Surgical Site Infection Prevention

## 2012-04-25 ENCOUNTER — Other Ambulatory Visit (HOSPITAL_COMMUNITY): Payer: Medicare Other

## 2012-04-25 MED ORDER — CEFAZOLIN SODIUM-DEXTROSE 2-3 GM-% IV SOLR
2.0000 g | INTRAVENOUS | Status: AC
  Administered 2012-04-26: 2 g via INTRAVENOUS
  Filled 2012-04-25: qty 50

## 2012-04-25 MED ORDER — DEXAMETHASONE SODIUM PHOSPHATE 10 MG/ML IJ SOLN
10.0000 mg | INTRAMUSCULAR | Status: DC
  Filled 2012-04-25: qty 1

## 2012-04-26 ENCOUNTER — Inpatient Hospital Stay (HOSPITAL_COMMUNITY)
Admission: RE | Admit: 2012-04-26 | Discharge: 2012-04-28 | DRG: 460 | Disposition: A | Discharge status: Home / Self Care | Payer: Medicare Other | Source: Ambulatory Visit | Attending: Neurosurgery | Admitting: Neurosurgery

## 2012-04-26 ENCOUNTER — Encounter (HOSPITAL_COMMUNITY): Payer: Self-pay | Admitting: Anesthesiology

## 2012-04-26 ENCOUNTER — Inpatient Hospital Stay (HOSPITAL_COMMUNITY): Payer: Medicare Other | Admitting: Anesthesiology

## 2012-04-26 ENCOUNTER — Encounter (HOSPITAL_COMMUNITY): Payer: Self-pay | Admitting: *Deleted

## 2012-04-26 ENCOUNTER — Encounter (HOSPITAL_COMMUNITY)
Admission: RE | Disposition: A | Discharge status: Home / Self Care | Payer: Self-pay | Source: Ambulatory Visit | Attending: Neurosurgery

## 2012-04-26 ENCOUNTER — Inpatient Hospital Stay (HOSPITAL_COMMUNITY): Payer: Medicare Other

## 2012-04-26 DIAGNOSIS — E039 Hypothyroidism, unspecified: Secondary | ICD-10-CM | POA: Diagnosis present

## 2012-04-26 DIAGNOSIS — M51379 Other intervertebral disc degeneration, lumbosacral region without mention of lumbar back pain or lower extremity pain: Principal | ICD-10-CM | POA: Diagnosis present

## 2012-04-26 DIAGNOSIS — M48062 Spinal stenosis, lumbar region with neurogenic claudication: Secondary | ICD-10-CM | POA: Diagnosis present

## 2012-04-26 DIAGNOSIS — M5137 Other intervertebral disc degeneration, lumbosacral region: Principal | ICD-10-CM | POA: Diagnosis present

## 2012-04-26 DIAGNOSIS — J4489 Other specified chronic obstructive pulmonary disease: Secondary | ICD-10-CM | POA: Diagnosis present

## 2012-04-26 DIAGNOSIS — K219 Gastro-esophageal reflux disease without esophagitis: Secondary | ICD-10-CM | POA: Diagnosis present

## 2012-04-26 DIAGNOSIS — I10 Essential (primary) hypertension: Secondary | ICD-10-CM | POA: Diagnosis present

## 2012-04-26 DIAGNOSIS — J449 Chronic obstructive pulmonary disease, unspecified: Secondary | ICD-10-CM | POA: Diagnosis present

## 2012-04-26 SURGERY — POSTERIOR LUMBAR FUSION 1 LEVEL
Anesthesia: General | Site: Back | Wound class: Clean

## 2012-04-26 MED ORDER — VECURONIUM BROMIDE 10 MG IV SOLR
INTRAVENOUS | Status: DC | PRN
  Administered 2012-04-26: 4 mg via INTRAVENOUS
  Administered 2012-04-26 (×2): 2 mg via INTRAVENOUS

## 2012-04-26 MED ORDER — ALUM & MAG HYDROXIDE-SIMETH 200-200-20 MG/5ML PO SUSP: 30.0000 mL | Freq: Four times a day (QID) | ORAL | Status: DC | PRN

## 2012-04-26 MED ORDER — DIAZEPAM 5 MG PO TABS
5.0000 mg | ORAL_TABLET | Freq: Four times a day (QID) | ORAL | Status: DC | PRN
  Administered 2012-04-26: 5 mg via ORAL
  Administered 2012-04-26 – 2012-04-27 (×3): 10 mg via ORAL
  Filled 2012-04-26 (×3): qty 2

## 2012-04-26 MED ORDER — METHYLCELLULOSE 1 % OP SOLN: 2.0000 [drp] | OPHTHALMIC | Status: DC | PRN

## 2012-04-26 MED ORDER — ONDANSETRON HCL 4 MG/2ML IJ SOLN
INTRAMUSCULAR | Status: DC | PRN
  Administered 2012-04-26: 4 mg via INTRAVENOUS

## 2012-04-26 MED ORDER — STERILE WATER FOR IRRIGATION IR SOLN
Status: DC | PRN
  Administered 2012-04-26: 1000 mL

## 2012-04-26 MED ORDER — LIDOCAINE HCL (CARDIAC) 20 MG/ML IV SOLN
INTRAVENOUS | Status: DC | PRN
  Administered 2012-04-26: 60 mg via INTRAVENOUS
  Administered 2012-04-26: 40 mg via INTRAVENOUS
  Administered 2012-04-26: 50 mg via INTRAVENOUS

## 2012-04-26 MED ORDER — FENTANYL 100 MCG/HR TD PT72
100.0000 ug | MEDICATED_PATCH | TRANSDERMAL | Status: DC
  Administered 2012-04-26: 100 ug via TRANSDERMAL
  Filled 2012-04-26: qty 1

## 2012-04-26 MED ORDER — OXYCODONE HCL 5 MG PO TABS
30.0000 mg | ORAL_TABLET | ORAL | Status: DC | PRN
  Administered 2012-04-26 – 2012-04-28 (×9): 30 mg via ORAL
  Filled 2012-04-26 (×9): qty 6

## 2012-04-26 MED ORDER — NEOSTIGMINE METHYLSULFATE 1 MG/ML IJ SOLN
INTRAMUSCULAR | Status: DC | PRN
  Administered 2012-04-26: 5 mg via INTRAVENOUS

## 2012-04-26 MED ORDER — FLEET ENEMA 7-19 GM/118ML RE ENEM
1.0000 | ENEMA | Freq: Once | RECTAL | Status: AC | PRN
  Filled 2012-04-26: qty 1

## 2012-04-26 MED ORDER — OXYCODONE-ACETAMINOPHEN 5-325 MG PO TABS: 1.0000 | ORAL_TABLET | ORAL | Status: DC | PRN

## 2012-04-26 MED ORDER — SIMVASTATIN 10 MG PO TABS
10.0000 mg | ORAL_TABLET | Freq: Every day | ORAL | Status: DC
  Administered 2012-04-27: 10 mg via ORAL
  Filled 2012-04-26 (×3): qty 1

## 2012-04-26 MED ORDER — ACETAMINOPHEN 650 MG RE SUPP: 650.0000 mg | RECTAL | Status: DC | PRN

## 2012-04-26 MED ORDER — THROMBIN 20000 UNITS EX KIT
PACK | CUTANEOUS | Status: DC | PRN
  Administered 2012-04-26: 14:00:00 via TOPICAL

## 2012-04-26 MED ORDER — BISACODYL 10 MG RE SUPP: 10.0000 mg | Freq: Every day | RECTAL | Status: DC | PRN

## 2012-04-26 MED ORDER — ARTIFICIAL TEARS OP OINT
TOPICAL_OINTMENT | OPHTHALMIC | Status: DC | PRN
  Administered 2012-04-26: 1 via OPHTHALMIC

## 2012-04-26 MED ORDER — ZOLPIDEM TARTRATE 5 MG PO TABS: 5.0000 mg | ORAL_TABLET | Freq: Every evening | ORAL | Status: DC | PRN

## 2012-04-26 MED ORDER — DIAZEPAM 5 MG PO TABS
ORAL_TABLET | ORAL | Status: AC
  Filled 2012-04-26: qty 1

## 2012-04-26 MED ORDER — HYDROMORPHONE HCL PF 1 MG/ML IJ SOLN
INTRAMUSCULAR | Status: AC
  Administered 2012-04-26: 0.5 mg
  Filled 2012-04-26: qty 1

## 2012-04-26 MED ORDER — BACITRACIN 50000 UNITS IM SOLR
INTRAMUSCULAR | Status: AC
  Filled 2012-04-26: qty 1

## 2012-04-26 MED ORDER — PHENOL 1.4 % MT LIQD: 1.0000 | OROMUCOSAL | Status: DC | PRN

## 2012-04-26 MED ORDER — POLYVINYL ALCOHOL 1.4 % OP SOLN
2.0000 [drp] | OPHTHALMIC | Status: DC | PRN
  Filled 2012-04-26: qty 15

## 2012-04-26 MED ORDER — BUPIVACAINE HCL (PF) 0.25 % IJ SOLN
INTRAMUSCULAR | Status: DC | PRN
  Administered 2012-04-26: 1 mL

## 2012-04-26 MED ORDER — METOPROLOL TARTRATE 25 MG PO TABS
25.0000 mg | ORAL_TABLET | Freq: Two times a day (BID) | ORAL | Status: DC
  Administered 2012-04-26 – 2012-04-27 (×2): 25 mg via ORAL
  Filled 2012-04-26 (×5): qty 1

## 2012-04-26 MED ORDER — HYDROMORPHONE HCL PF 1 MG/ML IJ SOLN
INTRAMUSCULAR | Status: AC
  Administered 2012-04-26 (×2): 0.5 mg
  Filled 2012-04-26: qty 2

## 2012-04-26 MED ORDER — SENNA 8.6 MG PO TABS
1.0000 | ORAL_TABLET | Freq: Two times a day (BID) | ORAL | Status: DC
  Administered 2012-04-26 – 2012-04-27 (×3): 8.6 mg via ORAL
  Filled 2012-04-26 (×6): qty 1

## 2012-04-26 MED ORDER — SODIUM CHLORIDE 0.9 % IV SOLN
INTRAVENOUS | Status: AC
  Filled 2012-04-26: qty 500

## 2012-04-26 MED ORDER — HYDROMORPHONE HCL PF 1 MG/ML IJ SOLN
0.2500 mg | INTRAMUSCULAR | Status: DC | PRN
  Administered 2012-04-26 (×3): 0.5 mg via INTRAVENOUS

## 2012-04-26 MED ORDER — ALBUTEROL SULFATE HFA 108 (90 BASE) MCG/ACT IN AERS
2.0000 | INHALATION_SPRAY | Freq: Four times a day (QID) | RESPIRATORY_TRACT | Status: DC | PRN
  Administered 2012-04-27: 2 via RESPIRATORY_TRACT
  Filled 2012-04-26: qty 6.7

## 2012-04-26 MED ORDER — HYDROCHLOROTHIAZIDE 25 MG PO TABS
25.0000 mg | ORAL_TABLET | Freq: Every day | ORAL | Status: DC
  Administered 2012-04-27: 25 mg via ORAL
  Filled 2012-04-26 (×3): qty 1

## 2012-04-26 MED ORDER — POLYETHYLENE GLYCOL 3350 17 G PO PACK
17.0000 g | PACK | Freq: Every day | ORAL | Status: DC | PRN
  Filled 2012-04-26: qty 1

## 2012-04-26 MED ORDER — FENTANYL CITRATE 0.05 MG/ML IJ SOLN
INTRAMUSCULAR | Status: DC | PRN
  Administered 2012-04-26: 150 ug via INTRAVENOUS
  Administered 2012-04-26: 100 ug via INTRAVENOUS
  Administered 2012-04-26: 50 ug via INTRAVENOUS

## 2012-04-26 MED ORDER — MIDAZOLAM HCL 5 MG/5ML IJ SOLN
INTRAMUSCULAR | Status: DC | PRN
  Administered 2012-04-26: 2 mg via INTRAVENOUS

## 2012-04-26 MED ORDER — DEXAMETHASONE SODIUM PHOSPHATE 4 MG/ML IJ SOLN
INTRAMUSCULAR | Status: DC | PRN
  Administered 2012-04-26: 10 mg via INTRAVENOUS

## 2012-04-26 MED ORDER — PANTOPRAZOLE SODIUM 40 MG PO PACK
40.0000 mg | PACK | Freq: Every day | ORAL | Status: DC
  Administered 2012-04-27: 40 mg via ORAL
  Filled 2012-04-26 (×2): qty 20

## 2012-04-26 MED ORDER — MENTHOL 3 MG MT LOZG: 1.0000 | LOZENGE | OROMUCOSAL | Status: DC | PRN

## 2012-04-26 MED ORDER — LEVOTHYROXINE SODIUM 88 MCG PO TABS
88.0000 ug | ORAL_TABLET | Freq: Every day | ORAL | Status: DC
  Administered 2012-04-27 – 2012-04-28 (×2): 88 ug via ORAL
  Filled 2012-04-26 (×3): qty 1

## 2012-04-26 MED ORDER — HYDROMORPHONE HCL PF 1 MG/ML IJ SOLN
0.5000 mg | INTRAMUSCULAR | Status: DC | PRN
Start: 2012-04-26 — End: 2012-04-28
  Administered 2012-04-27 – 2012-04-28 (×6): 1 mg via INTRAVENOUS
  Filled 2012-04-26 (×7): qty 1

## 2012-04-26 MED ORDER — CEFAZOLIN SODIUM 1-5 GM-% IV SOLN
1.0000 g | Freq: Three times a day (TID) | INTRAVENOUS | Status: AC
Start: 2012-04-26 — End: 2012-04-27
  Administered 2012-04-26 – 2012-04-27 (×2): 1 g via INTRAVENOUS
  Filled 2012-04-26 (×2): qty 50

## 2012-04-26 MED ORDER — ACETAMINOPHEN 325 MG PO TABS: 650.0000 mg | ORAL_TABLET | ORAL | Status: DC | PRN

## 2012-04-26 MED ORDER — PROPOFOL 10 MG/ML IV BOLUS
INTRAVENOUS | Status: DC | PRN
  Administered 2012-04-26: 150 mg via INTRAVENOUS

## 2012-04-26 MED ORDER — 0.9 % SODIUM CHLORIDE (POUR BTL) OPTIME
TOPICAL | Status: DC | PRN
  Administered 2012-04-26: 1000 mL

## 2012-04-26 MED ORDER — SODIUM CHLORIDE 0.9 % IR SOLN
Status: DC | PRN
  Administered 2012-04-26: 14:00:00

## 2012-04-26 MED ORDER — PROMETHAZINE HCL 25 MG PO TABS: 25.0000 mg | ORAL_TABLET | Freq: Three times a day (TID) | ORAL | Status: DC | PRN

## 2012-04-26 MED ORDER — SODIUM CHLORIDE 0.9 % IJ SOLN
3.0000 mL | Freq: Two times a day (BID) | INTRAMUSCULAR | Status: DC
  Administered 2012-04-26 – 2012-04-27 (×3): 3 mL via INTRAVENOUS

## 2012-04-26 MED ORDER — GLYCOPYRROLATE 0.2 MG/ML IJ SOLN
INTRAMUSCULAR | Status: DC | PRN
  Administered 2012-04-26: 0.6 mg via INTRAVENOUS

## 2012-04-26 MED ORDER — SODIUM CHLORIDE 0.9 % IJ SOLN: 3.0000 mL | INTRAMUSCULAR | Status: DC | PRN

## 2012-04-26 MED ORDER — HYDROCODONE-ACETAMINOPHEN 5-325 MG PO TABS: 1.0000 | ORAL_TABLET | ORAL | Status: DC | PRN

## 2012-04-26 MED ORDER — ROCURONIUM BROMIDE 100 MG/10ML IV SOLN
INTRAVENOUS | Status: DC | PRN
  Administered 2012-04-26: 40 mg via INTRAVENOUS

## 2012-04-26 MED ORDER — LACTATED RINGERS IV SOLN
INTRAVENOUS | Status: DC | PRN
  Administered 2012-04-26 (×2): via INTRAVENOUS

## 2012-04-26 MED ORDER — ONDANSETRON HCL 4 MG/2ML IJ SOLN: 4.0000 mg | INTRAMUSCULAR | Status: DC | PRN

## 2012-04-26 MED ORDER — DEXTROSE 5 % IV SOLN
INTRAVENOUS | Status: DC | PRN
  Administered 2012-04-26: 12:00:00 via INTRAVENOUS

## 2012-04-26 SURGICAL SUPPLY — 64 items
BAG DECANTER FOR FLEXI CONT (MISCELLANEOUS) ×2 IMPLANT
BENZOIN TINCTURE PRP APPL 2/3 (GAUZE/BANDAGES/DRESSINGS) ×2 IMPLANT
BLADE SURG ROTATE 9660 (MISCELLANEOUS) IMPLANT
BRUSH SCRUB EZ PLAIN DRY (MISCELLANEOUS) ×2 IMPLANT
BUR MATCHSTICK NEURO 3.0 LAGG (BURR) ×2 IMPLANT
CAGE 10X22 (Cage) ×2 IMPLANT
CANISTER SUCTION 2500CC (MISCELLANEOUS) ×2 IMPLANT
CAP LCK SPNE (Orthopedic Implant) ×4 IMPLANT
CAP LOCK SPINE RADIUS (Orthopedic Implant) ×4 IMPLANT
CAP LOCKING (Orthopedic Implant) ×4 IMPLANT
CLOTH BEACON ORANGE TIMEOUT ST (SAFETY) ×2 IMPLANT
CONT SPEC 4OZ CLIKSEAL STRL BL (MISCELLANEOUS) ×4 IMPLANT
COVER BACK TABLE 24X17X13 BIG (DRAPES) IMPLANT
COVER TABLE BACK 60X90 (DRAPES) ×2 IMPLANT
DECANTER SPIKE VIAL GLASS SM (MISCELLANEOUS) ×2 IMPLANT
DERMABOND ADVANCED (GAUZE/BANDAGES/DRESSINGS) ×1
DERMABOND ADVANCED .7 DNX12 (GAUZE/BANDAGES/DRESSINGS) ×1 IMPLANT
DRAPE C-ARM 42X72 X-RAY (DRAPES) ×4 IMPLANT
DRAPE LAPAROTOMY 100X72X124 (DRAPES) ×2 IMPLANT
DRAPE POUCH INSTRU U-SHP 10X18 (DRAPES) ×2 IMPLANT
DRAPE PROXIMA HALF (DRAPES) IMPLANT
DRAPE SURG 17X23 STRL (DRAPES) ×8 IMPLANT
ELECT REM PT RETURN 9FT ADLT (ELECTROSURGICAL) ×2
ELECTRODE REM PT RTRN 9FT ADLT (ELECTROSURGICAL) ×1 IMPLANT
EVACUATOR 1/8 PVC DRAIN (DRAIN) ×2 IMPLANT
GAUZE SPONGE 4X4 16PLY XRAY LF (GAUZE/BANDAGES/DRESSINGS) IMPLANT
GLOVE BIOGEL PI IND STRL 7.0 (GLOVE) ×2 IMPLANT
GLOVE BIOGEL PI IND STRL 7.5 (GLOVE) ×1 IMPLANT
GLOVE BIOGEL PI INDICATOR 7.0 (GLOVE) ×2
GLOVE BIOGEL PI INDICATOR 7.5 (GLOVE) ×1
GLOVE ECLIPSE 7.5 STRL STRAW (GLOVE) ×4 IMPLANT
GLOVE ECLIPSE 8.5 STRL (GLOVE) ×6 IMPLANT
GLOVE EXAM NITRILE LRG STRL (GLOVE) ×4 IMPLANT
GLOVE EXAM NITRILE MD LF STRL (GLOVE) IMPLANT
GLOVE EXAM NITRILE XL STR (GLOVE) IMPLANT
GLOVE EXAM NITRILE XS STR PU (GLOVE) IMPLANT
GLOVE SS BIOGEL STRL SZ 6.5 (GLOVE) ×3 IMPLANT
GLOVE SUPERSENSE BIOGEL SZ 6.5 (GLOVE) ×3
GOWN BRE IMP SLV AUR LG STRL (GOWN DISPOSABLE) ×2 IMPLANT
GOWN BRE IMP SLV AUR XL STRL (GOWN DISPOSABLE) ×4 IMPLANT
GOWN STRL REIN 2XL LVL4 (GOWN DISPOSABLE) IMPLANT
KIT BASIN OR (CUSTOM PROCEDURE TRAY) ×2 IMPLANT
KIT ROOM TURNOVER OR (KITS) ×2 IMPLANT
MILL MEDIUM DISP (BLADE) ×2 IMPLANT
NEEDLE HYPO 22GX1.5 SAFETY (NEEDLE) ×2 IMPLANT
NS IRRIG 1000ML POUR BTL (IV SOLUTION) ×2 IMPLANT
PACK LAMINECTOMY NEURO (CUSTOM PROCEDURE TRAY) ×2 IMPLANT
ROD RADIUS 40MM (Neuro Prosthesis/Implant) ×2 IMPLANT
ROD SPNL 40X5.5XNS TI RDS (Neuro Prosthesis/Implant) ×2 IMPLANT
SCREW 5.75X40M (Screw) ×6 IMPLANT
SPONGE GAUZE 4X4 12PLY (GAUZE/BANDAGES/DRESSINGS) ×2 IMPLANT
SPONGE SURGIFOAM ABS GEL 100 (HEMOSTASIS) ×2 IMPLANT
STRIP CLOSURE SKIN 1/2X4 (GAUZE/BANDAGES/DRESSINGS) ×2 IMPLANT
SUT VIC AB 0 CT1 18XCR BRD8 (SUTURE) ×2 IMPLANT
SUT VIC AB 0 CT1 8-18 (SUTURE) ×2
SUT VIC AB 2-0 CT1 18 (SUTURE) ×2 IMPLANT
SUT VIC AB 3-0 SH 8-18 (SUTURE) ×4 IMPLANT
SYR 20ML ECCENTRIC (SYRINGE) ×2 IMPLANT
TAPE CLOTH SURG 4X10 WHT LF (GAUZE/BANDAGES/DRESSINGS) ×2 IMPLANT
TOWEL OR 17X24 6PK STRL BLUE (TOWEL DISPOSABLE) ×2 IMPLANT
TOWEL OR 17X26 10 PK STRL BLUE (TOWEL DISPOSABLE) ×2 IMPLANT
TRAY FOLEY CATH 14FRSI W/METER (CATHETERS) ×2 IMPLANT
WATER STERILE IRR 1000ML POUR (IV SOLUTION) ×2 IMPLANT
WEDGE TANGENT 10X26MM ×2 IMPLANT

## 2012-04-26 NOTE — Progress Notes (Signed)
Pt states her pain is a 10/10. Dr Randa Evens notified. Will tx pt to rm and start po pain meds and get her moved  Onto a physical bed.

## 2012-04-26 NOTE — H&P (Signed)
[name] is an 47 y.o. female.   Chief Complaint: Back and bilateral leg pain HPI: 47 year old female status post previous L5-S1 decompression infusion for treatment of a lytic spondylolisthesis presents with severe progressive back and bilateral lower extremity pain right somewhat greater than left. Workup demonstrates evidence of severe disc space breakdown with retrolisthesis and stenosis at L4-5 consistent with progressive adjacent segment degeneration. Patient has failed conservative management and presents now for L4-5 decompression and fusion.  Past Medical History  Diagnosis Date  . Hypertension     takes Metoprolol and HCTZ daily  . Hyperlipidemia     takes Pravastatin daily  . Asthma   . COPD (chronic obstructive pulmonary disease)   . Emphysema   . Shortness of breath     lying/sitting/exertion;uses inhaler prn  . Pneumonia     last time a yr ago  . History of bronchitis     last time a yr ago  . Headache   . Arthritis   . Joint pain   . Joint swelling   . Chronic back pain     ruptured disc and stenosis  . GERD (gastroesophageal reflux disease)     takes Dexilant daily  . Chronic constipation     takes Linzess every other day  . History of colon polyps   . Ulcerative colitis   . Urinary frequency     takes HCTZ daily  . Urinary urgency   . Hypothyroidism     takes SYnthroid daily  . Dry eyes     eye drops prn  . Anxiety     takes Valium daily  . Bilateral cataracts   . Depression   . PONV (postoperative nausea and vomiting)     only one time 4-5 surgeries ago  . Umbilical hernia     Past Surgical History  Procedure Date  . Back surgery     x 3  . Neck surgery   . Carpal tunnel release     bilateral  . Abdominal hysterectomy   . Tubal ligation   . Cesarean section   . Cholecystectomy   . Hernia repair     x 2  . Colonoscopy   . Esophagogastroduodenoscopy     History reviewed. No pertinent family history. Social History:  reports that  she has been smoking Cigarettes.  She has a 49.5 pack-year smoking history. She has never used smokeless tobacco. She reports that she drinks alcohol. She reports that she does not use illicit drugs.  Allergies:  Allergies  Allergen Reactions  . Ciprofloxacin Nausea And Vomiting    Medications Prior to Admission  Medication Sig Dispense Refill  . albuterol (PROVENTIL HFA;VENTOLIN HFA) 108 (90 BASE) MCG/ACT inhaler Inhale 2 puffs into the lungs every 6 (six) hours as needed. As needed for shortness of breath.      . Aspirin-Acetaminophen-Caffeine (GOODY HEADACHE PO) Take 1-2 packets by mouth daily as needed. As needed for headaches.      . bisacodyl (DULCOLAX) 5 MG EC tablet Take 5 mg by mouth daily as needed. For constipation      . diazepam (VALIUM) 10 MG tablet Take 10 mg by mouth 3 (three) times daily.      Marland Kitchen esomeprazole (NEXIUM) 40 MG capsule Take 40 mg by mouth 2 (two) times daily.      . hydrochlorothiazide (HYDRODIURIL) 25 MG tablet Take 25 mg by mouth daily.      Marland Kitchen levothyroxine (SYNTHROID, LEVOTHROID) 88 MCG tablet Take 88 mcg  by mouth daily.      . methylcellulose (ARTIFICIAL TEARS) 1 % ophthalmic solution Place 2 drops into both eyes as needed. As needed for eye irritation.      Marland Kitchen oxyCODONE (ROXICODONE) 15 MG immediate release tablet Take 30 mg by mouth every 4 (four) hours as needed. As needed for pain.      . promethazine (PHENERGAN) 25 MG tablet Take 25 mg by mouth every 8 (eight) hours as needed. For nausea and vomiting      . docusate sodium (COLACE) 100 MG capsule Take 200 mg by mouth daily. For constipation      . fentaNYL (DURAGESIC - DOSED MCG/HR) 100 MCG/HR Place 1 patch onto the skin every other day.      . metoprolol tartrate (LOPRESSOR) 25 MG tablet Take 25 mg by mouth 2 (two) times daily.      . pravastatin (PRAVACHOL) 20 MG tablet Take 20 mg by mouth daily.         Results for orders placed during the hospital encounter of 04/26/12 (from the past 48 hour(s))   TYPE AND SCREEN     Status: Normal   Collection Time   04/26/12  9:30 AM      Component Value Range Comment   ABO/RH(D) A NEG      Antibody Screen POS      Sample Expiration 04/29/2012      No results found.  Review of Systems  Constitutional: Negative.   HENT: Negative.   Eyes: Negative.   Respiratory: Negative.   Cardiovascular: Negative.   Gastrointestinal: Negative.   Genitourinary: Negative.   Musculoskeletal: Negative.   Skin: Negative.   Neurological: Negative.   Endo/Heme/Allergies: Negative.   Psychiatric/Behavioral: Negative.     Blood pressure 139/78, pulse 57, temperature 97.8 F (36.6 C), temperature source Oral, resp. rate 18, SpO2 99.00%. Physical Exam  Constitutional: She is oriented to person, place, and time. She appears well-developed.  HENT:  Head: Normocephalic and atraumatic.  Right Ear: External ear normal.  Left Ear: External ear normal.  Nose: Nose normal.  Mouth/Throat: Oropharynx is clear and moist.  Eyes: Conjunctivae normal and EOM are normal. Pupils are equal, round, and reactive to light. Right eye exhibits no discharge. Left eye exhibits no discharge. No scleral icterus.  Neck: Normal range of motion. Neck supple. No tracheal deviation present. No thyromegaly present.  Cardiovascular: Normal rate, regular rhythm, normal heart sounds and intact distal pulses.  Exam reveals no friction rub.   No murmur heard. Respiratory: Effort normal and breath sounds normal. No respiratory distress. She has no wheezes.  GI: Soft. Bowel sounds are normal. She exhibits no distension. There is no tenderness.  Musculoskeletal: Normal range of motion. She exhibits no edema and no tenderness.  Neurological: She is alert and oriented to person, place, and time. She has normal reflexes. She displays normal reflexes. No cranial nerve deficit. She exhibits normal muscle tone. Coordination normal.  Skin: Skin is warm and dry. No rash noted. No erythema. No pallor.   Psychiatric: She has a normal mood and affect. Her behavior is normal. Judgment and thought content normal.     Assessment/Plan L4-5 stenosis with neurogenic claudication status post L5-S1 decompression and fusion with instrumentation. Plan L4-5 decompressive laminectomy with posterior lumbar my fusion using tangent interbody allograft wedge Telamon interbody peek cage and local autograft coupled with posterior lateral arthrodesis utilizing nonsegmental pedicle screw station and local autograft. This will require reexploration of her previous L5-S1 fusion and removal  of hardware. Risks and benefits have been explained. Patient wishes to proceed.  Mitchel Delduca A 04/26/2012, 11:44 AM

## 2012-04-26 NOTE — Progress Notes (Signed)
Pt has had 2mg  IV Dilaudid--states it is not doing anything for her pain. Dr Randa Evens notified. New order for 0.5 mg addit. IV Dilaudud. Will cont to monitor closely.

## 2012-04-26 NOTE — Preoperative (Addendum)
Beta Blockers   Reason not to administer Beta Blockers:Pt took Metoprolol 25 Mg PO on 04/24/2012 @0930  HRS.  Pt forgot to take Metoprolol on 04/25/2012. Pt not given Metoprolol on 04/26/2012 secondary to Bradycardia today Pre-op / Induction per Dr. Randa Evens.

## 2012-04-26 NOTE — Anesthesia Postprocedure Evaluation (Signed)
  Anesthesia Post-op Note  Patient: [name]  Procedure(s) Performed: Procedure(s) (LRB) with comments: POSTERIOR LUMBAR FUSION 1 LEVEL (N/A) - Posterior Lumbar Interbody Fusion of Lumbar four-five  with Instrumentation.  Patient Location: PACU  Anesthesia Type:General  Level of Consciousness: awake  Airway and Oxygen Therapy: Patient Spontanous Breathing  Post-op Pain: mild  Post-op Assessment: Post-op Vital signs reviewed  Post-op Vital Signs: Reviewed  Complications: No apparent anesthesia complications

## 2012-04-26 NOTE — Transfer of Care (Signed)
Immediate Anesthesia Transfer of Care Note  Patient: [name]  Procedure(s) Performed: Procedure(s) (LRB) with comments: POSTERIOR LUMBAR FUSION 1 LEVEL (N/A) - Posterior Lumbar Interbody Fusion of Lumbar four-five  with Instrumentation.  Patient Location: PACU  Anesthesia Type:General  Level of Consciousness: awake, oriented, patient cooperative and responds to stimulation  Airway & Oxygen Therapy: Patient Spontanous Breathing and Patient connected to nasal cannula oxygen  Post-op Assessment: Report given to PACU RN, Post -op Vital signs reviewed and stable, Patient moving all extremities and Patient moving all extremities X 4  Post vital signs: Reviewed and stable  Complications: No apparent anesthesia complications

## 2012-04-26 NOTE — Anesthesia Preprocedure Evaluation (Signed)
Anesthesia Evaluation  Patient identified by MRN, date of birth, ID band Patient awake    Reviewed: Allergy & Precautions, H&P , NPO status , Patient's Chart, lab work & pertinent test results  History of Anesthesia Complications (+) PONV  Airway Mallampati: II      Dental   Pulmonary shortness of breath, asthma , pneumonia -, COPD breath sounds clear to auscultation        Cardiovascular hypertension, Rhythm:Regular Rate:Normal     Neuro/Psych  Headaches, Anxiety Depression    GI/Hepatic PUD, GERD-  ,  Endo/Other  Hypothyroidism   Renal/GU      Musculoskeletal   Abdominal   Peds  Hematology   Anesthesia Other Findings   Reproductive/Obstetrics                           Anesthesia Physical Anesthesia Plan  ASA: III  Anesthesia Plan: General   Post-op Pain Management:    Induction: Intravenous  Airway Management Planned: Oral ETT  Additional Equipment:   Intra-op Plan:   Post-operative Plan: Extubation in OR  Informed Consent: I have reviewed the patients History and Physical, chart, labs and discussed the procedure including the risks, benefits and alternatives for the proposed anesthesia with the patient or authorized representative who has indicated his/her understanding and acceptance.   Dental advisory given  Plan Discussed with: CRNA, Anesthesiologist and Surgeon  Anesthesia Plan Comments:         Anesthesia Quick Evaluation

## 2012-04-26 NOTE — Progress Notes (Signed)
Pt. Didn't take beta blocker this am. Rechecked vital signs, pulse rate 57, therefore beta blocker not given.

## 2012-04-26 NOTE — Progress Notes (Signed)
Dr Randa Evens here and aware of pt's c/o pain med "not working" Additional 0.5 mg IV Dilaudid given, per Dr Randa Evens (total 3mg ) and po Valium given. Pt naps at intervals and appears much more comfortable. Will cont to monitor.

## 2012-04-26 NOTE — Anesthesia Procedure Notes (Addendum)
Anesthesia Regional Block:   Narrative:    Procedure Name: Intubation Date/Time: 04/26/2012 12:22 PM Performed by: Wray Kearns A Pre-anesthesia Checklist: Patient identified, Emergency Drugs available, Timeout performed, Suction available and Patient being monitored Patient Re-evaluated:Patient Re-evaluated prior to inductionOxygen Delivery Method: Circle system utilized Preoxygenation: Pre-oxygenation with 100% oxygen Intubation Type: IV induction and Cricoid Pressure applied Ventilation: Mask ventilation without difficulty and Oral airway inserted - appropriate to patient size Laryngoscope Size: Mac and 3 Grade View: Grade I Tube type: Oral Tube size: 7.5 mm Number of attempts: 1 Airway Equipment and Method: Stylet Placement Confirmation: ETT inserted through vocal cords under direct vision,  positive ETCO2 and breath sounds checked- equal and bilateral Secured at: 21 cm Tube secured with: Tape Dental Injury: Teeth and Oropharynx as per pre-operative assessment

## 2012-04-26 NOTE — Op Note (Signed)
Date of procedure:  04/26/2012  Date of dictation: Same  Service: Neurosurgery  Preoperative diagnosis: L4-5 stenosis with neurogenic claudication involving bilateral L4 and L5 nerve roots  Status post L5-S1 posterior lumbar fusion with instrumentation  Postoperative diagnosis: Same  Procedure Name: Reexploration of L4-5 laminectomy with bilateral complete L4-5 decompressive laminectomy and bilateral L4 and L5 decompressive foraminotomies, more than would be required for simple interbody fusion alone.  L4-5 posterior lumbar interbody fusion utilizing tangent interbody allograft wedge Telamon interbody peek cage and local autograft  L4-5 posterior lateral arthrodesis utilizing nonsegmental pedicle screw instrumentation and local autograft  Reexploration of L5-S1 posterior lumbar fusion with removal of instrumentation    Surgeon:Pinki Rottman A.Damek Ende, M.D.  Asst. Surgeon: Phoebe Perch  Anesthesia: General  Indication: 47 year old female status post previous L5-S1 decompression and fusion presents with worsening back and bilateral lower extremity symptoms consistent with neurogenic claudication. Workup demonstrates evidence of progressive stenosis at the L4-5 level involving both exiting L4 and L5 nerve roots. Patient appears to have solid fusion L5-S1. Plan is for L4-5 decompression and fusion with reexploration of L5-S1 fusion and removal of L5-S1 instrumentation.  Operative note: After induction of anesthesia, patient positioned prone onto Wilson frame and appropriately padded and lumbar region prepped and draped. Incision made extending from L4-S1. Subperiosteal dissection performed bilaterally exposing lamina and facet joints of L4-L5 and S1 as well as the previously placed pedicle screw station L5-S1 and the transverse processes of L4. Self retaining retractor placed intraoperative fluoroscopy used levels confirmed. Pedicle screw sedation at L5-S1 disassembled and rods and inferior S1 screws  removed bilaterally. The screw into L5 on the left was positioned somewhat laterally this was removed. The screw on the right at L5 was left in place. Laminectomy defect at L4-5 was dissected free. Complete laminectomy was then performed at L4 removing the entire lamina of L4 inferior facet of L4 superior facet of L5 were then flavum and epidural scar. Wide decompressive foraminotomies were performed on of course exiting L4 and L5 nerve roots bilaterally. Epidural venous plexus was quite related and cut. Bilateral discectomies were then performed. The space and distraction up to 10 mm with a 10 mm distractor less than patient's left side. Thecal sac and nerve respect on the right side. The space reamed and then cut with 10 mm tangent is. Soft tissues removed and interspace. 10 x 22 mm Telamon cage packed with morselized autograft and packed into place and recessed proximally 2 mm from the posterior cortical margin of L4. Distractor removed patient's left side. Thecal sac and nerve respect on the left side. The space was again reamed and then cut with 10 mm tangent his fingers soft tissues removed and interspace. Morselize autograft was impacted interspace. A 10 x 26 mm tangent wedges and packed in place and recessed roughly 1 mm from the posterior or margin of L4. Pedicles of L4 were then identified using surface landmarks and intraoperative fluoroscopy procedure bone overlying the pedicles and removed using high-speed drill pedicles then probed using a pedicle awl. Each pedicle awl track was probed and found to be solidly within the bone. 5.25 mm screw tap were then used bilaterally at L4. 5.75 x 45 mm radius screws placed bilaterally at L4. A  Entry side at L5 on the left was made using high-speed drill. Pedicle probed using pedicle awl. Pedicle awl track was found to be solidly within bone. This was again tapped and then a 5.75 x 40 mm screw was placed into L5. Transverse processes of  L4 and L5 were  decorticated high-speed drill. Morselized autograft was packed posterior laterally for later fusion. Short segment titanium rod was then placed over the screw heads at L4 and L5. Locking caps and placed over the screws were locking caps were then engaged with the construct under compression. Final images revealed good position the bone graft and hardware at proper upper level with normal S1. Gelfoam was placed topically for hemostasis. A medium Hemovac drain was left at per space. Wounds and close in layers with Vicryl sutures. Steri-Strips and sterile dressing were applied. There were no apparent complications. Patient tolerated the procedure well and she returns they're covering postop.

## 2012-04-26 NOTE — Plan of Care (Signed)
Problem: Consults Goal: Diagnosis - Spinal Surgery Outcome: Completed/Met Date Met:  04/26/12 Thoraco/Lumbar Spine Fusion     

## 2012-04-26 NOTE — Brief Op Note (Signed)
04/26/2012  2:31 PM  PATIENT:  [name]  47 y.o. female  PRE-OPERATIVE DIAGNOSIS:  Degenerative Disk Disease /stenosis  POST-OPERATIVE DIAGNOSIS:  Degenerative Disk Disease /stenosis  PROCEDURE:  Procedure(s) (LRB) with comments: POSTERIOR LUMBAR FUSION 1 LEVEL (N/A) - Posterior Lumbar Interbody Fusion of Lumbar four-five  with Instrumentation.  SURGEON:  Surgeon(s) and Role:    * Temple Pacini, MD - Primary    * Clydene Fake, MD - Assisting  PHYSICIAN ASSISTANT:   ASSISTANTS:    ANESTHESIA:   general  EBL:  Total I/O In: 1550 [I.V.:1550] Out: 250 [Urine:250]  BLOOD ADMINISTERED:none  DRAINS: (Medium) Hemovact drain(s) in the Epidural space with  Suction Open   LOCAL MEDICATIONS USED:  MARCAINE     SPECIMEN:  No Specimen  DISPOSITION OF SPECIMEN:  N/A  COUNTS:  YES  TOURNIQUET:  * No tourniquets in log *  DICTATION: .Dragon Dictation  PLAN OF CARE: Admit to inpatient   PATIENT DISPOSITION:  PACU - hemodynamically stable.   Delay start of Pharmacological VTE agent (>24hrs) due to surgical blood loss or risk of bleeding: yes

## 2012-04-27 MED ORDER — PNEUMOCOCCAL VAC POLYVALENT 25 MCG/0.5ML IJ INJ
0.5000 mL | INJECTION | INTRAMUSCULAR | Status: AC
  Filled 2012-04-27: qty 0.5

## 2012-04-27 MED FILL — Sodium Chloride Irrigation Soln 0.9%: Qty: 3000 | Status: AC

## 2012-04-27 MED FILL — Sodium Chloride IV Soln 0.9%: INTRAVENOUS | Qty: 1000 | Status: AC

## 2012-04-27 MED FILL — Heparin Sodium (Porcine) Inj 1000 Unit/ML: INTRAMUSCULAR | Qty: 30 | Status: AC

## 2012-04-27 NOTE — Progress Notes (Signed)
UR COMPLETED  

## 2012-04-27 NOTE — Progress Notes (Signed)
Postop day 1. Patient complains of back pain. No radiating pain level is still complains of some knee pain bilaterally. No complaints of numbness paresthesias or weakness.  She is afebrile. Vitals are stable. Wound is clean and dry. Drain output low. Motor sensory exam intact. Chest and abdomen benign.  Status post L4-5 decompression and fusion. Patient progressing reasonably well aside from some expected postoperative pain control issues. Plan to mobilize today. Probable discharge tomorrow.

## 2012-04-27 NOTE — Evaluation (Signed)
Physical Therapy Evaluation Patient Details Name: [name] MRN: [id number] DOB: [birth year] Today's Date: 04/27/2012 Time: 7829-5621 PT Time Calculation (min): 14 min  PT Assessment / Plan / Recommendation Clinical Impression  Pt adm for lumbar fusion.  Pt slightly unsteady in hallway but steadier in room.  should be able to return home with boyfriend.  No PT after dc needed.    PT Assessment  Patient needs continued PT services    Follow Up Recommendations  No PT follow up    Does the patient have the potential to tolerate intense rehabilitation      Barriers to Discharge        Equipment Recommendations  None recommended by PT    Recommendations for Other Services     Frequency Min 5X/week    Precautions / Restrictions Precautions Precautions: Back Precaution Booklet Issued: Yes (comment) Required Braces or Orthoses: Spinal Brace Spinal Brace: Lumbar corset;Applied in sitting position Restrictions Weight Bearing Restrictions: No   Pertinent Vitals/Pain Back pain - pt premedicated      Mobility  Bed Mobility Bed Mobility: Rolling Right;Right Sidelying to Sit Rolling Right: 5: Supervision Right Sidelying to Sit: 5: Supervision Details for Bed Mobility Assistance: verbal cues to roll prior to sitting up Transfers Sit to Stand: 4: Min guard;With upper extremity assist Stand to Sit: 4: Min guard;Without upper extremity assist Ambulation/Gait Ambulation/Gait Assistance: 4: Min assist Ambulation Distance (Feet): 300 Feet Assistive device: None;1 person hand held assist Ambulation/Gait Assistance Details: slightly unsteady in hall requiring HHA Gait Pattern: Step-through pattern;Decreased stride length Gait velocity: decr Stairs: Yes Stairs Assistance: 5: Supervision Stair Management Technique: One rail Left;Forwards;Step to pattern Number of Stairs: 1     Exercises     PT Diagnosis: Difficulty walking;Acute pain  PT Problem List: Decreased  balance;Decreased mobility;Decreased knowledge of precautions PT Treatment Interventions: Gait training;Functional mobility training;Patient/family education;Balance training;Therapeutic activities   PT Goals Acute Rehab PT Goals PT Goal Formulation: With patient Time For Goal Achievement: 04/29/12 Potential to Achieve Goals: Good Pt will go Sit to Stand: with modified independence PT Goal: Sit to Stand - Progress: Goal set today Pt will go Stand to Sit: with modified independence PT Goal: Stand to Sit - Progress: Goal set today Pt will Ambulate: >150 feet;with supervision PT Goal: Ambulate - Progress: Goal set today  Visit Information  Last PT Received On: 04/27/12 Assistance Needed: +1    Subjective Data  Subjective: Pt states this is her fourth back surgery Patient Stated Goal: Return home   Prior Functioning  Home Living Lives With: Significant other Available Help at Discharge: Available 24 hours/day Type of Home: Mobile home Home Access: Stairs to enter [employer] of Steps: 3 Entrance Stairs-Rails: Right;Left Home Layout: One level Bathroom Shower/Tub: Engineer, manufacturing systems: Standard Bathroom Accessibility: Yes Home Adaptive Equipment: None Prior Function Level of Independence: Independent Able to Take Stairs?: Yes Driving: Yes Communication Communication: No difficulties Dominant Hand: Right    Cognition  Cognition Overall Cognitive Status: Appears within functional limits for tasks assessed/performed Arousal/Alertness: Awake/alert Orientation Level: Appears intact for tasks assessed Behavior During Session: [facility] for tasks performed    Extremity/Trunk Assessment Right Upper Extremity Assessment RUE ROM/Strength/Tone: Within functional levels RUE Sensation: WFL - Light Touch RUE Coordination: WFL - gross/fine motor Left Upper Extremity Assessment LUE ROM/Strength/Tone: Within functional levels LUE Sensation: WFL - Light Touch LUE  Coordination: WFL - gross/fine motor Right Lower Extremity Assessment RLE ROM/Strength/Tone: WFL for tasks assessed Left Lower Extremity Assessment LLE ROM/Strength/Tone:  WFL for tasks assessed Trunk Assessment Trunk Assessment: Normal   Balance Balance Balance Assessed: Yes Dynamic Standing Balance Dynamic Standing - Balance Support: No upper extremity supported Dynamic Standing - Level of Assistance: 4: Min assist  End of Session PT - End of Session Equipment Utilized During Treatment: Back brace Activity Tolerance: Patient tolerated treatment well Patient left: with family/visitor present (going to bathroom) Nurse Communication: Mobility status  GP     Mariusz Jubb 04/27/2012, 12:02 PM  [facility] PT [phone]

## 2012-04-27 NOTE — Evaluation (Signed)
Occupational Therapy Evaluation Patient Details Name: [name] MRN: [id number] DOB: [birth year] Today's Date: 04/27/2012 Time: 5621-3086 OT Time Calculation (min): 43 min  OT Assessment / Plan / Recommendation Clinical Impression  Pleasant 47 yr old female admitted for lumbar fusion of L4-L5, and L5-S1.  Pt now presents with increased pain and slight decreased overall independence with ADLs.  Will have supervision from her fiance' at discharge.  Overall at a min guard assist to supervision level currently.  Feel she will reach modified indepednent level with increased activity over the next day or so and use of recommended AE and DME.  No further acute care OT needs.    OT Assessment  Patient does not need any further OT services    Follow Up Recommendations  No OT follow up       Equipment Recommendations  None recommended by OT;Other (comment) (pt to use small plastic chair in the shower)          Precautions / Restrictions Precautions Precautions: Back Precaution Booklet Issued: Yes (comment) Required Braces or Orthoses: Spinal Brace Spinal Brace: Lumbar corset;Applied in sitting position Restrictions Weight Bearing Restrictions: No   Pertinent Vitals/Pain Pain 8/10, meds given and pt repositioned    ADL  Eating/Feeding: Performed;Independent Where Assessed - Eating/Feeding: Chair Grooming: Performed;Supervision/safety Where Assessed - Grooming: Unsupported standing Upper Body Bathing: Simulated;Set up Where Assessed - Upper Body Bathing: Unsupported sitting Lower Body Bathing: Simulated;Minimal assistance Where Assessed - Lower Body Bathing: Supported sit to stand Upper Body Dressing: Performed;Supervision/safety Where Assessed - Upper Body Dressing: Unsupported sitting Lower Body Dressing: Performed;Minimal assistance Where Assessed - Lower Body Dressing: Supported sit to stand Toilet Transfer: Performed;Min guard Statistician Method: Other (comment)  (ambulate without assistive device) Acupuncturist: Comfort height toilet Toileting - Clothing Manipulation and Hygiene: Performed;Min guard Where Assessed - Engineer, mining and Hygiene: Sit to stand from 3-in-1 or toilet Tub/Shower Transfer: Simulated;Supervision/safety Tub/Shower Transfer Method: Other (comment) (step over edge of simulated tub) Equipment Used: Back brace Transfers/Ambulation Related to ADLs: Pt is overall min guard for mobility without assistive device.  Demonstrates slower speed than normal and occassional staggering but able to selfcorrect. ADL Comments: Pt educated on back precautions, use of AE for LB selfcare, and techniquest to adhere to back precautions.  Instructed pt to use a small plastic chair for the shower to help with her balance and pain initially at home.  Will have supervision from her fiance' as well.         Visit Information  Last OT Received On: 04/27/12 Assistance Needed: +1    Subjective Data  Subjective: I feel better than yesterday. Patient Stated Goal: Pt did not state but agreed to participate in therapy.   Prior Functioning     Home Living Lives With: Significant other Available Help at Discharge: Available 24 hours/day Type of Home: Mobile home Home Access: Stairs to enter Entrance Stairs-Number of Steps: 3 Entrance Stairs-Rails: Right;Left Home Layout: One level Bathroom Shower/Tub: Engineer, manufacturing systems: Standard Bathroom Accessibility: Yes Home Adaptive Equipment: None Prior Function Level of Independence: Independent Able to Take Stairs?: Yes Driving: Yes Communication Communication: No difficulties Dominant Hand: Right         Vision/Perception Vision - History Baseline Vision: No visual deficits Patient Visual Report: No change from baseline Vision - Assessment Eye Alignment: Within Functional Limits Vision Assessment: Vision not tested Perception Perception: Within  Functional Limits Praxis Praxis: Intact   Cognition  Cognition Overall Cognitive Status: Appears within  functional limits for tasks assessed/performed Arousal/Alertness: Awake/alert Orientation Level: Appears intact for tasks assessed Behavior During Session: [facility] for tasks performed    Extremity/Trunk Assessment Right Upper Extremity Assessment RUE ROM/Strength/Tone: Within functional levels RUE Sensation: WFL - Light Touch RUE Coordination: WFL - gross/fine motor Left Upper Extremity Assessment LUE ROM/Strength/Tone: Within functional levels LUE Sensation: WFL - Light Touch LUE Coordination: WFL - gross/fine motor Trunk Assessment Trunk Assessment: Normal     Mobility Bed Mobility Bed Mobility: Rolling Right;Right Sidelying to Sit Rolling Right: 5: Supervision Right Sidelying to Sit: 5: Supervision;HOB flat Transfers Transfers: Sit to Stand;Stand to Sit Sit to Stand: 4: Min guard;With upper extremity assist Stand to Sit: 4: Min guard;Without upper extremity assist        Balance Balance Balance Assessed: Yes Dynamic Standing Balance Dynamic Standing - Balance Support: No upper extremity supported Dynamic Standing - Level of Assistance: 4: Min assist   End of Session OT - End of Session Equipment Utilized During Treatment: Back brace Activity Tolerance: Patient limited by pain Patient left: in chair;with call bell/phone within reach;with family/visitor present Nurse Communication: Mobility status     [name] OTR/L Pager number [phone] 04/27/2012, 9:05 AM

## 2012-04-27 NOTE — Progress Notes (Signed)
Orthopedic Tech Progress Note Patient Details:  [name] 1966/03/18 [id number]  Patient ID: [name], female   DOB: 23-Dec-1965, 47 y.o.   MRN: [id number]   Shawnie Pons 04/27/2012, 8:19 AM L-S SUPPORT COMPLETED BY [employer].

## 2012-04-28 MED ORDER — DIAZEPAM 10 MG PO TABS: 10.0000 mg | ORAL_TABLET | Freq: Three times a day (TID) | ORAL | Status: AC

## 2012-04-28 MED ORDER — OXYCODONE HCL 15 MG PO TABS: 30.0000 mg | ORAL_TABLET | ORAL | Status: DC | PRN

## 2012-04-28 MED ORDER — FENTANYL 100 MCG/HR TD PT72: 1.0000 | MEDICATED_PATCH | TRANSDERMAL | Status: AC

## 2012-04-28 NOTE — Progress Notes (Signed)
Physical Therapy Discharge Patient Details Name: [name] MRN: [id number] DOB: Jun 20, 1965 Today's Date: 04/28/2012 Time: 0750-0809 PT Time Calculation (min): 19 min  Patient discharged from PT services secondary to mod I with basic mobility and goals met.  Please see latest therapy progress note for current level of functioning and progress toward goals.    Progress and discharge plan discussed with patient and/or caregiver:        [name] 04/28/2012, 8:21 AM

## 2012-04-28 NOTE — Discharge Summary (Signed)
Physician Discharge Summary  Patient ID: [name] MRN: [id number] DOB/AGE: 06/04/1965 47 y.o.  Admit date: 04/26/2012 Discharge date: 04/28/2012  Admission Diagnoses:  Discharge Diagnoses:  Principal Problem:  *Lumbar stenosis with neurogenic claudication   Discharged Condition: good  Hospital Course: Patient in the hospital where she underwent an uncomplicated L4-5 decompression and fusion. Postoperatively she is done reasonably well. She's had the expected amount of difficulty with postoperative pain control (gradually improved. She is up and ambulatory with minimal assistance. Her neurological exam is intact. Wound is healing well. She is ready for discharge home.  Consults:   Significant Diagnostic Studies:   Treatments:   Discharge Exam: Blood pressure 92/58, pulse 63, temperature 98.5 F (36.9 C), temperature source Oral, resp. rate 18, SpO2 99.00%. Awake and alert oriented and appropriate. Cranial nerve function is intact. Motor and sensory function of the extremities normal. Wound clean dry and intact. Chest and abdomen benign.  Disposition:      Medication List     As of 04/28/2012  8:26 AM    TAKE these medications         albuterol 108 (90 BASE) MCG/ACT inhaler   Commonly known as: PROVENTIL HFA;VENTOLIN HFA   Inhale 2 puffs into the lungs every 6 (six) hours as needed. As needed for shortness of breath.      bisacodyl 5 MG EC tablet   Commonly known as: DULCOLAX   Take 5 mg by mouth daily as needed. For constipation      diazepam 10 MG tablet   Commonly known as: VALIUM   Take 1 tablet (10 mg total) by mouth 3 (three) times daily.      docusate sodium 100 MG capsule   Commonly known as: COLACE   Take 200 mg by mouth daily. For constipation      esomeprazole 40 MG capsule   Commonly known as: NEXIUM   Take 40 mg by mouth 2 (two) times daily.      fentaNYL 100 MCG/HR   Commonly known as: DURAGESIC - dosed mcg/hr   Place 1 patch (100 mcg total)  onto the skin every other day.      GOODY HEADACHE PO   Take 1-2 packets by mouth daily as needed. As needed for headaches.      hydrochlorothiazide 25 MG tablet   Commonly known as: HYDRODIURIL   Take 25 mg by mouth daily.      levothyroxine 88 MCG tablet   Commonly known as: SYNTHROID, LEVOTHROID   Take 88 mcg by mouth daily.      methylcellulose 1 % ophthalmic solution   Commonly known as: ARTIFICIAL TEARS   Place 2 drops into both eyes as needed. As needed for eye irritation.      metoprolol tartrate 25 MG tablet   Commonly known as: LOPRESSOR   Take 25 mg by mouth 2 (two) times daily.      oxyCODONE 15 MG immediate release tablet   Commonly known as: ROXICODONE   Take 2 tablets (30 mg total) by mouth every 4 (four) hours as needed. As needed for pain.      pravastatin 20 MG tablet   Commonly known as: PRAVACHOL   Take 20 mg by mouth daily.      promethazine 25 MG tablet   Commonly known as: PHENERGAN   Take 25 mg by mouth every 8 (eight) hours as needed. For nausea and vomiting           Follow-up Information  Follow up with Giana Castner A, MD. Call in 1 week. (Ask for Lurena Joiner)    Contact information:   [address] [place] [address] [phone]          Signed: [employer] A 04/28/2012, 8:26 AM

## 2012-04-28 NOTE — Progress Notes (Signed)
Physical Therapy Treatment Patient Details Name: [name] MRN: [id number] DOB: Dec 28, 1965 Today's Date: 04/28/2012 Time: 0750-0809 PT Time Calculation (min): 19 min  PT Assessment / Plan / Recommendation Comments on Treatment Session  Pt has met all PT goals and is d/c from PT services. Recommend continued walking on the unit with back brace. Pt educated on back precautions and HEP.    Follow Up Recommendations  No PT follow up     Does the patient have the potential to tolerate intense rehabilitation     Barriers to Discharge        Equipment Recommendations  None recommended by PT    Recommendations for Other Services    Frequency     Plan All goals met and education completed, patient dischaged from PT services    Precautions / Restrictions Precautions Precautions: Back Precaution Comments: Pt able to Independently recall 3/3 precautions Required Braces or Orthoses: Spinal Brace Spinal Brace: Lumbar corset Restrictions Weight Bearing Restrictions: No   Pertinent Vitals/Pain     Mobility  Bed Mobility Right Sidelying to Sit: 6: Modified independent (Device/Increase time) Transfers Transfers: Sit to Stand Sit to Stand: 6: Modified independent (Device/Increase time) Stand to Sit: 6: Modified independent (Device/Increase time) Ambulation/Gait Ambulation/Gait Assistance: 6: Modified independent (Device/Increase time) Ambulation Distance (Feet): 200 Feet Assistive device: None Ambulation/Gait Assistance Details: mod independence due to increased time and pain. Gait Pattern: Decreased stride length Gait velocity: decreased    Exercises General Exercises - Lower Extremity Hip ABduction/ADduction: AROM;Strengthening;Both;10 reps;Standing Hip Flexion/Marching: AROM;Strengthening;Both;10 reps;Standing Heel Raises: AROM;Strengthening;Both;10 reps;Standing Mini-Sqauts: AROM;Strengthening;Both;10 reps;Standing   PT Diagnosis:    PT Problem List:   PT Treatment  Interventions:     PT Goals Acute Rehab PT Goals PT Goal: Sit to Stand - Progress: Met PT Goal: Stand to Sit - Progress: Met PT Goal: Ambulate - Progress: Met  Visit Information  Last PT Received On: 04/28/12    Subjective Data  Subjective: My groin and legs hurt. This has been the most painful back surgery.   Cognition  Cognition Overall Cognitive Status: Appears within functional limits for tasks assessed/performed Arousal/Alertness: Awake/alert Orientation Level: Appears intact for tasks assessed Behavior During Session: [facility] for tasks performed    Balance  Balance Balance Assessed: Yes Dynamic Standing Balance Dynamic Standing - Balance Support: No upper extremity supported Dynamic Standing - Level of Assistance: 6: Modified independent (Device/Increase time)  End of Session PT - End of Session Equipment Utilized During Treatment: Back brace Activity Tolerance: Patient tolerated treatment well Patient left: in bed;with call bell/phone within reach;with family/visitor present Nurse Communication: Mobility status   GP     [name] 04/28/2012, 8:18 AM

## 2012-04-28 NOTE — Progress Notes (Signed)
Pt and husband given D/C instructions with Rx's, verbal understanding given. Pt D/C'd home via wheelchair with husband per MD order. Rema Fendt, RN

## 2012-04-29 LAB — TYPE AND SCREEN
DAT, IgG: NEGATIVE
Donor AG Type: NEGATIVE
Donor AG Type: NEGATIVE
Unit division: 0

## 2012-05-11 ENCOUNTER — Encounter (HOSPITAL_COMMUNITY): Payer: Self-pay | Admitting: *Deleted

## 2012-05-11 ENCOUNTER — Encounter (HOSPITAL_COMMUNITY)
Admission: AD | Disposition: A | Discharge status: Home / Self Care | Payer: Self-pay | Source: Other Acute Inpatient Hospital | Attending: Neurosurgery

## 2012-05-11 ENCOUNTER — Inpatient Hospital Stay (HOSPITAL_COMMUNITY)
Admission: AD | Admit: 2012-05-11 | Discharge: 2012-05-19 | DRG: 862 | Disposition: A | Discharge status: Home / Self Care | Payer: Medicare Other | Source: Other Acute Inpatient Hospital | Attending: Neurosurgery | Admitting: Neurosurgery

## 2012-05-11 ENCOUNTER — Encounter (HOSPITAL_COMMUNITY): Payer: Self-pay | Admitting: Anesthesiology

## 2012-05-11 ENCOUNTER — Inpatient Hospital Stay (HOSPITAL_COMMUNITY): Payer: Medicare Other

## 2012-05-11 ENCOUNTER — Inpatient Hospital Stay (HOSPITAL_COMMUNITY): Payer: Medicare Other | Admitting: Anesthesiology

## 2012-05-11 DIAGNOSIS — J438 Other emphysema: Secondary | ICD-10-CM | POA: Diagnosis present

## 2012-05-11 DIAGNOSIS — Z860100 Personal history of colon polyps, unspecified: Secondary | ICD-10-CM

## 2012-05-11 DIAGNOSIS — A4902 Methicillin resistant Staphylococcus aureus infection, unspecified site: Secondary | ICD-10-CM | POA: Diagnosis present

## 2012-05-11 DIAGNOSIS — F3289 Other specified depressive episodes: Secondary | ICD-10-CM | POA: Diagnosis present

## 2012-05-11 DIAGNOSIS — T8140XA Infection following a procedure, unspecified, initial encounter: Principal | ICD-10-CM | POA: Diagnosis present

## 2012-05-11 DIAGNOSIS — M129 Arthropathy, unspecified: Secondary | ICD-10-CM | POA: Diagnosis present

## 2012-05-11 DIAGNOSIS — E785 Hyperlipidemia, unspecified: Secondary | ICD-10-CM | POA: Diagnosis present

## 2012-05-11 DIAGNOSIS — E039 Hypothyroidism, unspecified: Secondary | ICD-10-CM | POA: Diagnosis present

## 2012-05-11 DIAGNOSIS — F329 Major depressive disorder, single episode, unspecified: Secondary | ICD-10-CM | POA: Diagnosis present

## 2012-05-11 DIAGNOSIS — K219 Gastro-esophageal reflux disease without esophagitis: Secondary | ICD-10-CM | POA: Diagnosis present

## 2012-05-11 DIAGNOSIS — Z23 Encounter for immunization: Secondary | ICD-10-CM

## 2012-05-11 DIAGNOSIS — Z79899 Other long term (current) drug therapy: Secondary | ICD-10-CM

## 2012-05-11 DIAGNOSIS — G061 Intraspinal abscess and granuloma: Secondary | ICD-10-CM | POA: Diagnosis present

## 2012-05-11 DIAGNOSIS — I10 Essential (primary) hypertension: Secondary | ICD-10-CM | POA: Diagnosis present

## 2012-05-11 DIAGNOSIS — F17200 Nicotine dependence, unspecified, uncomplicated: Secondary | ICD-10-CM | POA: Diagnosis present

## 2012-05-11 DIAGNOSIS — Y838 Other surgical procedures as the cause of abnormal reaction of the patient, or of later complication, without mention of misadventure at the time of the procedure: Secondary | ICD-10-CM | POA: Diagnosis present

## 2012-05-11 DIAGNOSIS — F411 Generalized anxiety disorder: Secondary | ICD-10-CM | POA: Diagnosis present

## 2012-05-11 HISTORY — PX: LUMBAR WOUND DEBRIDEMENT: SHX1988

## 2012-05-11 LAB — COMPREHENSIVE METABOLIC PANEL
ALT: 15 U/L (ref 0–35)
Alkaline Phosphatase: 125 U/L — ABNORMAL HIGH (ref 39–117)
BUN: 10 mg/dL (ref 6–23)
CO2: 25 mEq/L (ref 19–32)
GFR calc Af Amer: >90 mL/min (ref >90)
GFR calc non Af Amer: >90 mL/min (ref >90)
Glucose, Bld: 121 mg/dL — ABNORMAL HIGH (ref 70–99)
Potassium: 3.7 mEq/L (ref 3.5–5.1)
Sodium: 136 mEq/L (ref 135–145)

## 2012-05-11 LAB — SURGICAL PCR SCREEN
MRSA, PCR: NEGATIVE
Staphylococcus aureus: NEGATIVE

## 2012-05-11 LAB — CBC
HCT: 33.2 % — ABNORMAL LOW (ref 36.0–46.0)
Hemoglobin: 10.7 g/dL — ABNORMAL LOW (ref 12.0–15.0)
MCH: 29.2 pg (ref 26.0–34.0)
RBC: 3.66 MIL/uL — ABNORMAL LOW (ref 3.87–5.11)

## 2012-05-11 LAB — GRAM STAIN

## 2012-05-11 SURGERY — LUMBAR WOUND DEBRIDEMENT
Anesthesia: General | Laterality: Bilateral | Wound class: Dirty or Infected

## 2012-05-11 MED ORDER — ONDANSETRON HCL 4 MG/2ML IJ SOLN
4.0000 mg | Freq: Four times a day (QID) | INTRAMUSCULAR | Status: DC | PRN
  Administered 2012-05-11 – 2012-05-13 (×5): 4 mg via INTRAVENOUS
  Filled 2012-05-11 (×5): qty 2

## 2012-05-11 MED ORDER — LEVOTHYROXINE SODIUM 88 MCG PO TABS
88.0000 ug | ORAL_TABLET | Freq: Every day | ORAL | Status: DC
  Administered 2012-05-12 – 2012-05-19 (×8): 88 ug via ORAL
  Filled 2012-05-11 (×10): qty 1

## 2012-05-11 MED ORDER — PANTOPRAZOLE SODIUM 40 MG PO PACK
40.0000 mg | PACK | Freq: Every day | ORAL | Status: DC
  Filled 2012-05-11: qty 20

## 2012-05-11 MED ORDER — PROMETHAZINE HCL 25 MG/ML IJ SOLN
25.0000 mg | Freq: Four times a day (QID) | INTRAMUSCULAR | Status: DC | PRN
  Administered 2012-05-11: 25 mg via INTRAVENOUS
  Filled 2012-05-11 (×2): qty 1

## 2012-05-11 MED ORDER — METHYLCELLULOSE 1 % OP SOLN: 2.0000 [drp] | OPHTHALMIC | Status: DC | PRN

## 2012-05-11 MED ORDER — BISACODYL 10 MG RE SUPP
10.0000 mg | Freq: Every day | RECTAL | Status: DC | PRN
  Filled 2012-05-11 (×2): qty 1

## 2012-05-11 MED ORDER — LACTATED RINGERS IV SOLN
INTRAVENOUS | Status: DC | PRN
  Administered 2012-05-11 (×2): via INTRAVENOUS

## 2012-05-11 MED ORDER — PROPOFOL 10 MG/ML IV BOLUS
INTRAVENOUS | Status: DC | PRN
  Administered 2012-05-11: 200 mg via INTRAVENOUS

## 2012-05-11 MED ORDER — ONDANSETRON HCL 4 MG/2ML IJ SOLN
INTRAMUSCULAR | Status: DC | PRN
  Administered 2012-05-11: 4 mg via INTRAVENOUS

## 2012-05-11 MED ORDER — SODIUM CHLORIDE 0.9 % IV SOLN
250.0000 mL | INTRAVENOUS | Status: DC
  Administered 2012-05-11: 60 mL/h via INTRAVENOUS

## 2012-05-11 MED ORDER — SIMVASTATIN 10 MG PO TABS
10.0000 mg | ORAL_TABLET | Freq: Every day | ORAL | Status: DC
  Administered 2012-05-12 – 2012-05-18 (×7): 10 mg via ORAL
  Filled 2012-05-11 (×8): qty 1

## 2012-05-11 MED ORDER — CEFAZOLIN SODIUM 1-5 GM-% IV SOLN
1.0000 g | Freq: Three times a day (TID) | INTRAVENOUS | Status: DC
  Filled 2012-05-11 (×2): qty 50

## 2012-05-11 MED ORDER — SODIUM CHLORIDE 0.9 % IR SOLN
Status: DC | PRN
  Administered 2012-05-11 (×2)

## 2012-05-11 MED ORDER — SODIUM CHLORIDE 0.9 % IJ SOLN: 3.0000 mL | INTRAMUSCULAR | Status: DC | PRN

## 2012-05-11 MED ORDER — DIAZEPAM 5 MG PO TABS
10.0000 mg | ORAL_TABLET | Freq: Three times a day (TID) | ORAL | Status: DC
  Administered 2012-05-11 – 2012-05-19 (×23): 10 mg via ORAL
  Filled 2012-05-11: qty 2
  Filled 2012-05-11: qty 1
  Filled 2012-05-11 (×3): qty 2
  Filled 2012-05-11 (×2): qty 1
  Filled 2012-05-11 (×12): qty 2
  Filled 2012-05-11: qty 1
  Filled 2012-05-11 (×6): qty 2

## 2012-05-11 MED ORDER — POLYVINYL ALCOHOL 1.4 % OP SOLN
2.0000 [drp] | OPHTHALMIC | Status: DC | PRN
  Administered 2012-05-11: 2 [drp] via OPHTHALMIC
  Filled 2012-05-11: qty 15

## 2012-05-11 MED ORDER — GADOBENATE DIMEGLUMINE 529 MG/ML IV SOLN
15.0000 mL | Freq: Once | INTRAVENOUS | Status: AC | PRN
  Administered 2012-05-11: 15 mL via INTRAVENOUS

## 2012-05-11 MED ORDER — SODIUM CHLORIDE 0.9 % IJ SOLN
3.0000 mL | Freq: Two times a day (BID) | INTRAMUSCULAR | Status: DC
  Administered 2012-05-11 – 2012-05-13 (×5): 3 mL via INTRAVENOUS

## 2012-05-11 MED ORDER — DIPHENHYDRAMINE HCL 12.5 MG/5ML PO ELIX
12.5000 mg | ORAL_SOLUTION | Freq: Four times a day (QID) | ORAL | Status: DC | PRN
  Filled 2012-05-11: qty 5

## 2012-05-11 MED ORDER — OXYCODONE HCL 5 MG/5ML PO SOLN: 5.0000 mg | Freq: Once | ORAL | Status: AC | PRN

## 2012-05-11 MED ORDER — MORPHINE SULFATE (PF) 1 MG/ML IV SOLN
INTRAVENOUS | Status: AC
  Administered 2012-05-11: 21:00:00
  Filled 2012-05-11: qty 25

## 2012-05-11 MED ORDER — OXYCODONE HCL 5 MG PO TABS: 5.0000 mg | ORAL_TABLET | Freq: Once | ORAL | Status: AC | PRN

## 2012-05-11 MED ORDER — DEXTROSE 5 % IV SOLN
2.0000 g | Freq: Two times a day (BID) | INTRAVENOUS | Status: DC
  Administered 2012-05-11 – 2012-05-12 (×2): 2 g via INTRAVENOUS
  Filled 2012-05-11 (×3): qty 2

## 2012-05-11 MED ORDER — HYDROCHLOROTHIAZIDE 25 MG PO TABS
25.0000 mg | ORAL_TABLET | Freq: Every day | ORAL | Status: DC
  Administered 2012-05-12 – 2012-05-18 (×7): 25 mg via ORAL
  Filled 2012-05-11 (×8): qty 1

## 2012-05-11 MED ORDER — ACETAMINOPHEN 325 MG PO TABS: 650.0000 mg | ORAL_TABLET | ORAL | Status: DC | PRN

## 2012-05-11 MED ORDER — LIDOCAINE HCL 1 % IJ SOLN
INTRAMUSCULAR | Status: DC | PRN
  Administered 2012-05-11: 60 mg via INTRADERMAL

## 2012-05-11 MED ORDER — ONDANSETRON HCL 4 MG/2ML IJ SOLN: 4.0000 mg | INTRAMUSCULAR | Status: DC | PRN

## 2012-05-11 MED ORDER — SENNA 8.6 MG PO TABS
1.0000 | ORAL_TABLET | Freq: Two times a day (BID) | ORAL | Status: DC
  Administered 2012-05-11 – 2012-05-19 (×16): 8.6 mg via ORAL
  Filled 2012-05-11 (×17): qty 1

## 2012-05-11 MED ORDER — DIPHENHYDRAMINE HCL 50 MG/ML IJ SOLN: 12.5000 mg | Freq: Four times a day (QID) | INTRAMUSCULAR | Status: DC | PRN

## 2012-05-11 MED ORDER — NALOXONE HCL 0.4 MG/ML IJ SOLN: 0.4000 mg | INTRAMUSCULAR | Status: DC | PRN

## 2012-05-11 MED ORDER — PANTOPRAZOLE SODIUM 40 MG PO TBEC
40.0000 mg | DELAYED_RELEASE_TABLET | Freq: Every day | ORAL | Status: DC
  Administered 2012-05-11 – 2012-05-19 (×9): 40 mg via ORAL
  Filled 2012-05-11 (×8): qty 1

## 2012-05-11 MED ORDER — ACETAMINOPHEN 325 MG PO TABS
650.0000 mg | ORAL_TABLET | ORAL | Status: DC | PRN
  Administered 2012-05-11 – 2012-05-16 (×2): 650 mg via ORAL
  Filled 2012-05-11 (×2): qty 2

## 2012-05-11 MED ORDER — SODIUM CHLORIDE 0.9 % IJ SOLN: 9.0000 mL | INTRAMUSCULAR | Status: DC | PRN

## 2012-05-11 MED ORDER — MENTHOL 3 MG MT LOZG: 1.0000 | LOZENGE | OROMUCOSAL | Status: DC | PRN

## 2012-05-11 MED ORDER — MORPHINE SULFATE (PF) 1 MG/ML IV SOLN
INTRAVENOUS | Status: AC
  Administered 2012-05-11: 04:00:00
  Filled 2012-05-11: qty 25

## 2012-05-11 MED ORDER — METOPROLOL TARTRATE 25 MG PO TABS
25.0000 mg | ORAL_TABLET | Freq: Two times a day (BID) | ORAL | Status: DC
  Administered 2012-05-11 – 2012-05-18 (×6): 25 mg via ORAL
  Filled 2012-05-11 (×17): qty 1

## 2012-05-11 MED ORDER — PHENOL 1.4 % MT LIQD: 1.0000 | OROMUCOSAL | Status: DC | PRN

## 2012-05-11 MED ORDER — ARTIFICIAL TEARS OP OINT
TOPICAL_OINTMENT | OPHTHALMIC | Status: DC | PRN
  Administered 2012-05-11: 1 via OPHTHALMIC

## 2012-05-11 MED ORDER — SUCCINYLCHOLINE CHLORIDE 20 MG/ML IJ SOLN
INTRAMUSCULAR | Status: DC | PRN
  Administered 2012-05-11: 100 mg via INTRAVENOUS

## 2012-05-11 MED ORDER — DEXTROSE IN LACTATED RINGERS 5 % IV SOLN
INTRAVENOUS | Status: DC
  Administered 2012-05-11: 04:00:00 via INTRAVENOUS

## 2012-05-11 MED ORDER — VANCOMYCIN HCL 1000 MG IV SOLR
1000.0000 mg | Freq: Two times a day (BID) | INTRAVENOUS | Status: DC
  Administered 2012-05-11: 1000 mg via INTRAVENOUS

## 2012-05-11 MED ORDER — DEXTROSE 5 % IV SOLN
1.0000 g | Freq: Once | INTRAVENOUS | Status: DC
  Filled 2012-05-11: qty 10

## 2012-05-11 MED ORDER — ALUM & MAG HYDROXIDE-SIMETH 200-200-20 MG/5ML PO SUSP: 30.0000 mL | Freq: Four times a day (QID) | ORAL | Status: DC | PRN

## 2012-05-11 MED ORDER — ALBUTEROL SULFATE HFA 108 (90 BASE) MCG/ACT IN AERS
2.0000 | INHALATION_SPRAY | Freq: Four times a day (QID) | RESPIRATORY_TRACT | Status: DC | PRN
  Administered 2012-05-11 – 2012-05-12 (×2): 2 via RESPIRATORY_TRACT
  Filled 2012-05-11: qty 6.7

## 2012-05-11 MED ORDER — HYDROMORPHONE HCL PF 1 MG/ML IJ SOLN: 0.2500 mg | INTRAMUSCULAR | Status: DC | PRN

## 2012-05-11 MED ORDER — POLYETHYLENE GLYCOL 3350 17 G PO PACK
17.0000 g | PACK | Freq: Every day | ORAL | Status: DC | PRN
  Administered 2012-05-16 – 2012-05-18 (×2): 17 g via ORAL
  Filled 2012-05-11 (×2): qty 1

## 2012-05-11 MED ORDER — FLEET ENEMA 7-19 GM/118ML RE ENEM
1.0000 | ENEMA | Freq: Once | RECTAL | Status: AC | PRN
  Filled 2012-05-11: qty 1

## 2012-05-11 MED ORDER — DEXTROSE 5 % IV SOLN
2.0000 g | INTRAVENOUS | Status: DC
  Filled 2012-05-11: qty 2

## 2012-05-11 MED ORDER — MORPHINE SULFATE (PF) 1 MG/ML IV SOLN
INTRAVENOUS | Status: DC
  Administered 2012-05-11 (×2): via INTRAVENOUS
  Administered 2012-05-11: 18 mg via INTRAVENOUS
  Administered 2012-05-11: 3.78 mg via INTRAVENOUS
  Administered 2012-05-11: 22 mg via INTRAVENOUS
  Administered 2012-05-11: 19.5 mg via INTRAVENOUS
  Administered 2012-05-12 (×3): via INTRAVENOUS
  Administered 2012-05-12: 25.5 mg via INTRAVENOUS
  Administered 2012-05-12: 19.5 mg via INTRAVENOUS
  Administered 2012-05-12 (×2): via INTRAVENOUS
  Administered 2012-05-12: 16.5 mg via INTRAVENOUS
  Administered 2012-05-12: 17.5 mg via INTRAVENOUS
  Administered 2012-05-13: 12.95 mg via INTRAVENOUS
  Administered 2012-05-13: 14:00:00 via INTRAVENOUS
  Administered 2012-05-13: 24 mg via INTRAVENOUS
  Administered 2012-05-13: 04:00:00 via INTRAVENOUS
  Administered 2012-05-13: 28.5 mg via INTRAVENOUS
  Administered 2012-05-13: 09:00:00 via INTRAVENOUS
  Administered 2012-05-14: 10.5 mg via INTRAVENOUS
  Administered 2012-05-14: 24.96 mg via INTRAVENOUS
  Administered 2012-05-14: 25.35 mg via INTRAVENOUS
  Filled 2012-05-11 (×14): qty 25

## 2012-05-11 MED ORDER — 0.9 % SODIUM CHLORIDE (POUR BTL) OPTIME
TOPICAL | Status: DC | PRN
  Administered 2012-05-11: 1000 mL

## 2012-05-11 MED ORDER — DEXMEDETOMIDINE HCL IN NACL 200 MCG/50ML IV SOLN
0.2000 ug/kg/h | INTRAVENOUS | Status: AC
  Filled 2012-05-11: qty 50

## 2012-05-11 MED ORDER — ACETAMINOPHEN 650 MG RE SUPP: 650.0000 mg | RECTAL | Status: DC | PRN

## 2012-05-11 MED ORDER — VANCOMYCIN HCL 1000 MG IV SOLR
750.0000 mg | Freq: Three times a day (TID) | INTRAVENOUS | Status: DC
  Administered 2012-05-12 – 2012-05-13 (×6): 750 mg via INTRAVENOUS
  Filled 2012-05-11 (×7): qty 750

## 2012-05-11 MED ORDER — DEXAMETHASONE SODIUM PHOSPHATE 4 MG/ML IJ SOLN
INTRAMUSCULAR | Status: DC | PRN
  Administered 2012-05-11: 4 mg via INTRAVENOUS

## 2012-05-11 MED ORDER — BIOTENE DRY MOUTH MT LIQD
15.0000 mL | Freq: Two times a day (BID) | OROMUCOSAL | Status: DC
  Administered 2012-05-12 – 2012-05-18 (×7): 15 mL via OROMUCOSAL

## 2012-05-11 SURGICAL SUPPLY — 48 items
BAG DECANTER FOR FLEXI CONT (MISCELLANEOUS) ×2 IMPLANT
BENZOIN TINCTURE PRP APPL 2/3 (GAUZE/BANDAGES/DRESSINGS) IMPLANT
BLADE SURG ROTATE 9660 (MISCELLANEOUS) IMPLANT
BRUSH SCRUB EZ PLAIN DRY (MISCELLANEOUS) ×2 IMPLANT
CANISTER SUCTION 2500CC (MISCELLANEOUS) ×2 IMPLANT
CLOTH BEACON ORANGE TIMEOUT ST (SAFETY) ×2 IMPLANT
CONT SPEC 4OZ CLIKSEAL STRL BL (MISCELLANEOUS) ×2 IMPLANT
DERMABOND ADVANCED (GAUZE/BANDAGES/DRESSINGS) ×1
DERMABOND ADVANCED .7 DNX12 (GAUZE/BANDAGES/DRESSINGS) ×1 IMPLANT
DRAPE LAPAROTOMY 100X72X124 (DRAPES) ×2 IMPLANT
DRAPE POUCH INSTRU U-SHP 10X18 (DRAPES) ×2 IMPLANT
DRAPE SURG 17X23 STRL (DRAPES) IMPLANT
DURAPREP 26ML APPLICATOR (WOUND CARE) ×2 IMPLANT
ELECT REM PT RETURN 9FT ADLT (ELECTROSURGICAL) ×2
ELECTRODE REM PT RTRN 9FT ADLT (ELECTROSURGICAL) ×1 IMPLANT
EVACUATOR 1/8 PVC DRAIN (DRAIN) ×2 IMPLANT
GAUZE SPONGE 4X4 16PLY XRAY LF (GAUZE/BANDAGES/DRESSINGS) IMPLANT
GLOVE ECLIPSE 8.5 STRL (GLOVE) ×2 IMPLANT
GLOVE EXAM NITRILE LRG STRL (GLOVE) IMPLANT
GLOVE EXAM NITRILE MD LF STRL (GLOVE) ×4 IMPLANT
GLOVE EXAM NITRILE XL STR (GLOVE) IMPLANT
GLOVE EXAM NITRILE XS STR PU (GLOVE) IMPLANT
GLOVE INDICATOR 7.0 STRL GRN (GLOVE) ×4 IMPLANT
GLOVE SURG SS PI 7.0 STRL IVOR (GLOVE) ×6 IMPLANT
GOWN BRE IMP SLV AUR LG STRL (GOWN DISPOSABLE) IMPLANT
GOWN BRE IMP SLV AUR XL STRL (GOWN DISPOSABLE) ×6 IMPLANT
GOWN STRL REIN 2XL LVL4 (GOWN DISPOSABLE) IMPLANT
KIT BASIN OR (CUSTOM PROCEDURE TRAY) ×2 IMPLANT
KIT ROOM TURNOVER OR (KITS) ×2 IMPLANT
NEEDLE HYPO 25X1 1.5 SAFETY (NEEDLE) IMPLANT
NS IRRIG 1000ML POUR BTL (IV SOLUTION) ×2 IMPLANT
PACK LAMINECTOMY NEURO (CUSTOM PROCEDURE TRAY) ×2 IMPLANT
SPONGE GAUZE 4X4 12PLY (GAUZE/BANDAGES/DRESSINGS) ×2 IMPLANT
SPONGE SURGIFOAM ABS GEL SZ50 (HEMOSTASIS) ×2 IMPLANT
STRIP CLOSURE SKIN 1/2X4 (GAUZE/BANDAGES/DRESSINGS) IMPLANT
SURGIFOAM 50 SPONGE IMPLANT
SUT ETHILON 2 0 FS 18 (SUTURE) ×6 IMPLANT
SUT VIC AB 0 CT1 18XCR BRD8 (SUTURE) ×1 IMPLANT
SUT VIC AB 0 CT1 8-18 (SUTURE) ×1
SUT VIC AB 2-0 CT1 18 (SUTURE) ×2 IMPLANT
SWAB CULTURE LIQ STUART DBL (MISCELLANEOUS) ×2 IMPLANT
SYR 20ML ECCENTRIC (SYRINGE) ×2 IMPLANT
THROMBIN 5000 IMPLANT
TOWEL OR 17X24 6PK STRL BLUE (TOWEL DISPOSABLE) ×2 IMPLANT
TOWEL OR 17X26 10 PK STRL BLUE (TOWEL DISPOSABLE) ×2 IMPLANT
TRAY FOLEY CATH 14FRSI W/METER (CATHETERS) ×2 IMPLANT
TUBE ANAEROBIC SPECIMEN COL (MISCELLANEOUS) ×2 IMPLANT
WATER STERILE IRR 1000ML POUR (IV SOLUTION) ×2 IMPLANT

## 2012-05-11 NOTE — Anesthesia Procedure Notes (Signed)
Procedure Name: Intubation Date/Time: 05/11/2012 5:21 PM Performed by: Leona Singleton A Pre-anesthesia Checklist: Patient identified Patient Re-evaluated:Patient Re-evaluated prior to inductionOxygen Delivery Method: Circle system utilized Preoxygenation: Pre-oxygenation with 100% oxygen Intubation Type: IV induction, Rapid sequence and Cricoid Pressure applied Laryngoscope Size: Miller and 3 Grade View: Grade I Tube type: Oral Tube size: 7.0 mm Number of attempts: 1 Airway Equipment and Method: Stylet Placement Confirmation: ETT inserted through vocal cords under direct vision,  positive ETCO2,  CO2 detector and breath sounds checked- equal and bilateral Secured at: 21 cm Tube secured with: Tape Dental Injury: Teeth and Oropharynx as per pre-operative assessment

## 2012-05-11 NOTE — Transfer of Care (Signed)
Immediate Anesthesia Transfer of Care Note  Patient: [name]  Procedure(s) Performed: Procedure(s): LUMBAR WOUND DEBRIDEMENT (Bilateral)  Patient Location: PACU  Anesthesia Type:General  Level of Consciousness: awake, alert  and oriented  Airway & Oxygen Therapy: Patient Spontanous Breathing and Patient connected to face mask oxygen  Post-op Assessment: Report given to PACU RN, Post -op Vital signs reviewed and stable and Patient moving all extremities X 4  Post vital signs: Reviewed and stable  Complications: No apparent anesthesia complications

## 2012-05-11 NOTE — Op Note (Signed)
Date of procedure: 05/11/2012  Date of dictation: Same  Service: Neurosurgery  Preoperative diagnosis: Lumbar wound infection  Postoperative diagnosis: Same  Procedure Name: Irrigation and debridement of lumbar wound  Surgeon:Nova Evett A.Mekala Winger, M.D.  Asst. Surgeon: None  Anesthesia: General  Indication: 47 year old female little over 2 weeks status post L4-5 decompression and fusion. Surgery uncomplicated. Patient discharged home on second postoperative day. Initial course notable only for back pain. Patient presents now with a 72 hours history of increasing fevers pain and some radicular symptoms. She's had drainage from her wound which is been serous in nature. MRI scanning today demonstrates findings worrisome for deep wound space infection. Patient taken to the operating room for irrigation and debridement of lumbar wound.  Operative note: After induction of anesthesia, patient positioned prone onto Wilson frame and her appropriately padded. Lumbar region prepped and draped. Lumbar wound with re\re opened.. Purulent  fluid under some pressure was discovered. Cultures were taken. Fascia was opened and the deep space explored. Aggressive debridement of the paraspinal musculature was undertaken. Gelfoam removed from the dura. Lateral gutters explored. No evidence of CSF leak. Wound was closely. With bacitracin irrigation. IV antibiotics were given. Medium Hemovac drain was left in epidural space. Wounds closed with Vicryl sutures at the lumbodorsal fascia and nylon surface sutures. No apparent complications. Patient returns to the recovery room postop.

## 2012-05-11 NOTE — Anesthesia Preprocedure Evaluation (Addendum)
Anesthesia Evaluation  Patient identified by MRN, date of birth, ID band Patient awake    Reviewed: Allergy & Precautions, H&P , NPO status , Patient's Chart, lab work & pertinent test results  History of Anesthesia Complications (+) PONV  Airway Mallampati: II TM Distance: >3 FB Neck ROM: Full    Dental no notable dental hx. (+) Edentulous Upper, Edentulous Lower and Dental Advisory Given   Pulmonary shortness of breath and with exertion, asthma , pneumonia -, resolved, COPD COPD inhaler, Current Smoker,  breath sounds clear to auscultation  Pulmonary exam normal       Cardiovascular hypertension, Pt. on medications and Pt. on home beta blockers + DOE Rhythm:Regular Rate:Normal     Neuro/Psych  Headaches, PSYCHIATRIC DISORDERS Anxiety Depression    GI/Hepatic Neg liver ROS, PUD, GERD-  Medicated and Controlled,Umbilical hernia   Endo/Other  Hypothyroidism   Renal/GU negative Renal ROS  negative genitourinary   Musculoskeletal  (+) Arthritis -, Osteoarthritis,    Abdominal   Peds  Hematology  (+) Blood dyscrasia, anemia ,   Anesthesia Other Findings   Reproductive/Obstetrics negative OB ROS                      Anesthesia Physical Anesthesia Plan  ASA: III  Anesthesia Plan: General   Post-op Pain Management:    Induction: Intravenous  Airway Management Planned: Oral ETT  Additional Equipment:   Intra-op Plan:   Post-operative Plan: Extubation in OR  Informed Consent: I have reviewed the patients History and Physical, chart, labs and discussed the procedure including the risks, benefits and alternatives for the proposed anesthesia with the patient or authorized representative who has indicated his/her understanding and acceptance.   Dental advisory given  Plan Discussed with: CRNA  Anesthesia Plan Comments:         Anesthesia Quick Evaluation

## 2012-05-11 NOTE — Progress Notes (Signed)
ANTIBIOTIC CONSULT NOTE - INITIAL  Pharmacy Consult for vanc Indication: Lumbar wound infection  Allergies  Allergen Reactions  . Ciprofloxacin Nausea And Vomiting    Patient Measurements: Height: 5\' 5"  (165.1 cm) Weight: 174 lb 14.4 oz (79.334 kg) IBW/kg (Calculated) : 57  Vital Signs: Temp: 98 F (36.7 C) (02/19 2007) Temp src: Oral (02/19 1443) BP: 119/66 mmHg (02/19 2015) Pulse Rate: 64 (02/19 2015) Intake/Output from previous day:   Intake/Output from this shift:    Labs:  Recent Labs  05/11/12 0635  WBC 16.2*  HGB 10.7*  PLT 155  CREATININE 0.76   Estimated Creatinine Clearance: 90.4 ml/min (by C-G formula based on Cr of 0.76). No results found for this basename: VANCOTROUGH, Leodis Binet, VANCORANDOM, GENTTROUGH, GENTPEAK, GENTRANDOM, TOBRATROUGH, TOBRAPEAK, TOBRARND, AMIKACINPEAK, AMIKACINTROU, AMIKACIN,  in the last 72 hours   Microbiology: Recent Results (from the past 720 hour(s))  SURGICAL PCR SCREEN     Status: None   Collection Time    05/11/12  3:28 AM      Result Value Range Status   MRSA, PCR NEGATIVE  NEGATIVE Final   Staphylococcus aureus NEGATIVE  NEGATIVE Final   Comment:            The Xpert SA Assay (FDA     approved for NASAL specimens     in patients over 52 years of age),     is one component of     a comprehensive surveillance     program.  Test performance has     been validated by [employer] for patients greater     than or equal to 18 year old.     It is not intended     to diagnose infection nor to     guide or monitor treatment.  GRAM STAIN     Status: None   Collection Time    05/11/12  6:11 PM      Result Value Range Status   Specimen Description WOUND BACK   Final   Special Requests NONE   Final   Gram Stain     Final   Value: ABUNDANT WBC PRESENT,BOTH PMN AND MONONUCLEAR     FEW GRAM POSITIVE COCCI IN PAIRS     Gram Stain Report Called to,Read Back By and Verified With: Corlis Leak RN [phone] 05/11/12 A BROWNING   Report Status 05/11/2012 FINAL   Final    Medical History: Past Medical History  Diagnosis Date  . Hypertension     takes Metoprolol and HCTZ daily  . Hyperlipidemia     takes Pravastatin daily  . Asthma   . COPD (chronic obstructive pulmonary disease)   . Emphysema   . Shortness of breath     lying/sitting/exertion;uses inhaler prn  . Pneumonia     last time a yr ago  . History of bronchitis     last time a yr ago  . Headache   . Arthritis   . Joint pain   . Joint swelling   . Chronic back pain     ruptured disc and stenosis  . GERD (gastroesophageal reflux disease)     takes Dexilant daily  . Chronic constipation     takes Linzess every other day  . History of colon polyps   . Ulcerative colitis   . Urinary frequency     takes HCTZ daily  . Urinary urgency   . Hypothyroidism     takes SYnthroid daily  .  Dry eyes     eye drops prn  . Anxiety     takes Valium daily  . Bilateral cataracts   . Depression   . PONV (postoperative nausea and vomiting)     only one time 4-5 surgeries ago  . Umbilical hernia     Medications:  Scheduled:  .  ceFAZolin (ANCEF) IV  1 g Intravenous Q8H  . cefTRIAXone (ROCEPHIN)  IV  2 g Intravenous Q12H  . diazepam  10 mg Oral TID  . [START ON 05/12/2012] hydrochlorothiazide  25 mg Oral Daily  . [START ON 05/12/2012] levothyroxine  88 mcg Oral Q breakfast  . metoprolol tartrate  25 mg Oral BID  . morphine   Intravenous Q4H  . [COMPLETED] morphine      . morphine      . pantoprazole sodium  40 mg Oral Daily  . senna  1 tablet Oral BID  . [START ON 05/12/2012] simvastatin  10 mg Oral q1800  . sodium chloride  3 mL Intravenous Q12H  . [DISCONTINUED] cefTRIAXone (ROCEPHIN)  IV  1 g Intravenous Once  . [DISCONTINUED] cefTRIAXone (ROCEPHIN) 2 g IVPB  2 g Intravenous Q24H  . [DISCONTINUED] vancomycin (VANCOCIN) 1000 mg IVPB  1,000 mg Intravenous Q12H   Assessment: 47 yo with a hx of recent lumbar fusion who was readmitted last PM for a  suspected infection. MRI showed worrisome for abscess at wound site. She was taken to the OR for I&D today. Her gram stain shows GPC which is likely a staph infection. She did get vanc during the procedure and will be continued post op.  Goal of Therapy:  Vancomycin trough level 15-20 mcg/ml  Plan:   Vanc 750mg  IV q8 F/u with trough at steady state  Ulyses Southward [place] 05/11/2012,8:48 PM

## 2012-05-11 NOTE — Anesthesia Postprocedure Evaluation (Signed)
  Anesthesia Post-op Note  Patient: [name]  Procedure(s) Performed: Procedure(s): LUMBAR WOUND DEBRIDEMENT (Bilateral)  Patient Location: PACU  Anesthesia Type:General  Level of Consciousness: awake, alert  and oriented  Airway and Oxygen Therapy: Patient Spontanous Breathing and Patient connected to nasal cannula oxygen  Post-op Pain: mild  Post-op Assessment: Post-op Vital signs reviewed, Patient's Cardiovascular Status Stable, Respiratory Function Stable, Patent Airway and NAUSEA AND VOMITING PRESENT  Post-op Vital Signs: stable  Complications: No apparent anesthesia complications

## 2012-05-11 NOTE — Preoperative (Signed)
Beta Blockers   Reason not to administer Beta Blockers:Hold beta blocker due to hypotension 

## 2012-05-11 NOTE — Progress Notes (Signed)
Patient readmitted last night by Dr.botero for pain control and evaluation of fever with wound drainage. Patient seen in the office last week. Although she had significant incisional pain she was having no wound drainage or other signs of infection. There was no recognized CSF leak intraoperatively.  On exam she is running a low-grade fever. She is obviously very uncomfortable. Her wound is non-erythematous. There is some serous drainage dried on her bed sheets but there is no active drainage from the wound that I can determine currently. Her motor examination is intact. Her sensory examination is stable. She has severe pain with any movement however. Her white count is elevated to 16,000.  I agree with situation is worrisome for postoperative wound infection. MRI scan scheduled for this morning for further evaluation. I will decide with regard to surgical exploration depending on the MRI scan.

## 2012-05-11 NOTE — Brief Op Note (Signed)
05/11/2012  6:03 PM  PATIENT:  [name]  47 y.o. female  PRE-OPERATIVE DIAGNOSIS:  wound exploration  POST-OPERATIVE DIAGNOSIS:  Lumbar wound infection   PROCEDURE:  Procedure(s): LUMBAR WOUND DEBRIDEMENT (Bilateral)  SURGEON:  Surgeon(s) and Role:    * Temple Pacini, MD - Primary  PHYSICIAN ASSISTANT:   ASSISTANTS:    ANESTHESIA:   general  EBL:  Total I/O In: 0  Out: 400 [Urine:300; Blood:100]  BLOOD ADMINISTERED:none  DRAINS: (Medium) Hemovact drain(s) in the Epidural space with  Suction Open   LOCAL MEDICATIONS USED:  NONE  SPECIMEN:  No Specimen  DISPOSITION OF SPECIMEN:  N/A  COUNTS:  YES  TOURNIQUET:  * No tourniquets in log *  DICTATION: .Dragon Dictation  PLAN OF CARE: Admit to inpatient   PATIENT DISPOSITION:  PACU - hemodynamically stable.   Delay start of Pharmacological VTE agent (>24hrs) due to surgical blood loss or risk of bleeding: yes

## 2012-05-11 NOTE — Progress Notes (Signed)
Patient vomited a large amount of brown/black emesis. Patient has a temp of 101.3.  Patient was in a -5 degree trendelenburg position.  Returned patient to flat position and administered IV zofran. Notified Dr. Jeral Fruit.  Will continue to monitor patient.

## 2012-05-11 NOTE — Progress Notes (Signed)
Received results from MRI, called Dr. Jordan Likes with results.

## 2012-05-11 NOTE — H&P (Signed)
[name] is an 47 y.o. female.   Chief Complaint: headache HPI: patient transferred by ambulance from a [place] ER where she was taken because fever, headache and fluid leafing from the lumbar operative site for e days. She has had l45 lumbar fusion about 2 weeks ago by dr Jordan Likes.no weakness or sensory changes  Past Medical History  Diagnosis Date  . Hypertension     takes Metoprolol and HCTZ daily  . Hyperlipidemia     takes Pravastatin daily  . Asthma   . COPD (chronic obstructive pulmonary disease)   . Emphysema   . Shortness of breath     lying/sitting/exertion;uses inhaler prn  . Pneumonia     last time a yr ago  . History of bronchitis     last time a yr ago  . Headache   . Arthritis   . Joint pain   . Joint swelling   . Chronic back pain     ruptured disc and stenosis  . GERD (gastroesophageal reflux disease)     takes Dexilant daily  . Chronic constipation     takes Linzess every other day  . History of colon polyps   . Ulcerative colitis   . Urinary frequency     takes HCTZ daily  . Urinary urgency   . Hypothyroidism     takes SYnthroid daily  . Dry eyes     eye drops prn  . Anxiety     takes Valium daily  . Bilateral cataracts   . Depression   . PONV (postoperative nausea and vomiting)     only one time 4-5 surgeries ago  . Umbilical hernia     Past Surgical History  Procedure Laterality Date  . Back surgery      x 3  . Neck surgery    . Carpal tunnel release      bilateral  . Abdominal hysterectomy    . Tubal ligation    . Cesarean section    . Cholecystectomy    . Hernia repair      x 2  . Colonoscopy    . Esophagogastroduodenoscopy      History reviewed. No pertinent family history. Social History:  reports that she has been smoking Cigarettes.  She has a 49.5 pack-year smoking history. She has never used smokeless tobacco. She reports that  drinks alcohol. She reports that she does not use illicit drugs.  Allergies:   Allergies  Allergen Reactions  . Ciprofloxacin Nausea And Vomiting    Medications Prior to Admission  Medication Sig Dispense Refill  . albuterol (PROVENTIL HFA;VENTOLIN HFA) 108 (90 BASE) MCG/ACT inhaler Inhale 2 puffs into the lungs every 6 (six) hours as needed. As needed for shortness of breath.      . Aspirin-Acetaminophen-Caffeine (GOODY HEADACHE PO) Take 1-2 packets by mouth daily as needed. As needed for headaches.      . bisacodyl (DULCOLAX) 5 MG EC tablet Take 5 mg by mouth daily as needed. For constipation      . diazepam (VALIUM) 10 MG tablet Take 1 tablet (10 mg total) by mouth 3 (three) times daily.  60 tablet  1  . docusate sodium (COLACE) 100 MG capsule Take 200 mg by mouth daily. For constipation      . esomeprazole (NEXIUM) 40 MG capsule Take 40 mg by mouth 2 (two) times daily.      . fentaNYL (DURAGESIC - DOSED MCG/HR) 100 MCG/HR Place 1 patch (100 mcg total)  onto the skin every other day.  15 patch  0  . hydrochlorothiazide (HYDRODIURIL) 25 MG tablet Take 25 mg by mouth daily.      Marland Kitchen levothyroxine (SYNTHROID, LEVOTHROID) 88 MCG tablet Take 88 mcg by mouth daily.      . methylcellulose (ARTIFICIAL TEARS) 1 % ophthalmic solution Place 2 drops into both eyes as needed. As needed for eye irritation.      . metoprolol tartrate (LOPRESSOR) 25 MG tablet Take 25 mg by mouth 2 (two) times daily.      Marland Kitchen oxyCODONE (ROXICODONE) 15 MG immediate release tablet Take 2 tablets (30 mg total) by mouth every 4 (four) hours as needed. As needed for pain.  90 tablet  0  . pravastatin (PRAVACHOL) 20 MG tablet Take 20 mg by mouth daily.       . promethazine (PHENERGAN) 25 MG tablet Take 25 mg by mouth every 8 (eight) hours as needed. For nausea and vomiting        Results for orders placed during the hospital encounter of 05/11/12 (from the past 48 hour(s))  SURGICAL PCR SCREEN     Status: None   Collection Time    05/11/12  3:28 AM      Result Value Range   MRSA, PCR NEGATIVE  NEGATIVE    Staphylococcus aureus NEGATIVE  NEGATIVE   Comment:            The Xpert SA Assay (FDA     approved for NASAL specimens     in patients over 14 years of age),     is one component of     a comprehensive surveillance     program.  Test performance has     been validated by [employer] for patients greater     than or equal to 47 year old.     It is not intended     to diagnose infection nor to     guide or monitor treatment.  CBC     Status: Abnormal   Collection Time    05/11/12  6:35 AM      Result Value Range   WBC 16.2 (*) 4.0 - 10.5 K/uL   RBC 3.66 (*) 3.87 - 5.11 MIL/uL   Hemoglobin 10.7 (*) 12.0 - 15.0 g/dL   HCT 16.1 (*) 09.6 - 04.5 %   MCV 90.7  78.0 - 100.0 fL   MCH 29.2  26.0 - 34.0 pg   MCHC 32.2  30.0 - 36.0 g/dL   RDW 40.9  81.1 - 91.4 %   Platelets 155  150 - 400 K/uL  COMPREHENSIVE METABOLIC PANEL     Status: Abnormal   Collection Time    05/11/12  6:35 AM      Result Value Range   Sodium 136  135 - 145 mEq/L   Potassium 3.7  3.5 - 5.1 mEq/L   Chloride 102  96 - 112 mEq/L   CO2 25  19 - 32 mEq/L   Glucose, Bld 121 (*) 70 - 99 mg/dL   BUN 10  6 - 23 mg/dL   Creatinine, Ser 7.82  0.50 - 1.10 mg/dL   Calcium 8.6  8.4 - 95.6 mg/dL   Total Protein 6.3  6.0 - 8.3 g/dL   Albumin 3.2 (*) 3.5 - 5.2 g/dL   AST 20  0 - 37 U/L   ALT 15  0 - 35 U/L   Alkaline Phosphatase 125 (*) 39 -  117 U/L   Total Bilirubin 0.3  0.3 - 1.2 mg/dL   GFR calc non Af Amer >90  >90 mL/min   GFR calc Af Amer >90  >90 mL/min   Comment:            The eGFR has been calculated     using the CKD EPI equation.     This calculation has not been     validated in all clinical     situations.     eGFR's persistently     <90 mL/min signify     possible Chronic Kidney Disease.   Dg Chest Port 1 View  05/11/2012  *RADIOLOGY REPORT*  Clinical Data: Incisional infection.  PORTABLE CHEST - 1 VIEW  Comparison: 10/27/2011  Findings: Heart is upper limits normal in size.  Minimal right base  atelectasis with slight elevation of the right hemidiaphragm.  No confluent opacity on the left.  No effusions or acute bony abnormality.  IMPRESSION: Right base atelectasis.   Original Report Authenticated By: Charlett Nose, M.D.     Review of Systems  Constitutional: Positive for fever.  Respiratory: Negative.   Cardiovascular: Negative.   Genitourinary: Negative.   Musculoskeletal: Positive for back pain.  Skin: Negative.   Neurological: Positive for headaches.  Endo/Heme/Allergies: Negative.   Psychiatric/Behavioral: Negative.     Blood pressure 103/62, pulse 73, temperature 100.8 F (38.2 C), temperature source Oral, resp. rate 20, height 5\' 5"  (1.651 m), weight 79.334 kg (174 lb 14.4 oz), SpO2 95.00%. Physical Exam hent, nl. Neck, nl. Cv, nl. Lungs, clear. Abdomen, soft, extremities, nl. Neuro normal. Wound without evidence of infection but klear fluid coming from the upper part.  Assessment/Plan Rule out lumbar csf leak. Patient npo and will have a lumbar mri. DR Pool aware and will take over her care  Olyvia Gopal M 05/11/2012, 8:34 AM

## 2012-05-11 NOTE — Progress Notes (Signed)
Dr. Venetia Maxon notified of gram stain result. Order given for vanc protocol per pharmacy.

## 2012-05-12 DIAGNOSIS — G061 Intraspinal abscess and granuloma: Secondary | ICD-10-CM | POA: Diagnosis present

## 2012-05-12 MED ORDER — DEXTROSE 5 % IV SOLN
2.0000 g | INTRAVENOUS | Status: DC
  Administered 2012-05-13 – 2012-05-16 (×4): 2 g via INTRAVENOUS
  Filled 2012-05-12 (×4): qty 2

## 2012-05-12 MED ORDER — OXYCODONE-ACETAMINOPHEN 5-325 MG PO TABS
1.0000 | ORAL_TABLET | Freq: Four times a day (QID) | ORAL | Status: DC | PRN
  Administered 2012-05-12 – 2012-05-14 (×5): 2 via ORAL
  Filled 2012-05-12 (×5): qty 2

## 2012-05-12 MED ORDER — SODIUM CHLORIDE 0.9 % IV BOLUS (SEPSIS)
500.0000 mL | Freq: Once | INTRAVENOUS | Status: AC
  Administered 2012-05-12: 500 mL via INTRAVENOUS

## 2012-05-12 MED ORDER — SODIUM CHLORIDE 0.9 % IV SOLN
250.0000 mL | INTRAVENOUS | Status: DC
  Administered 2012-05-12 – 2012-05-14 (×2): 1000 mL via INTRAVENOUS

## 2012-05-12 NOTE — Evaluation (Signed)
Occupational Therapy Evaluation Patient Details Name: [name] MRN: [id number] DOB: [birth year] Today's Date: 05/12/2012 Time: 7829-5621 OT Time Calculation (min): 31 min  OT Assessment / Plan / Recommendation Clinical Impression  47 yo female admitted for fever and pain in [place] ED. Pt transfered to [facility] for I & D of lumbar wound and r/o CSF leak. Pt progressing well. Ot to follow acutely. Recommend HHOT for d/c planning     OT Assessment  Patient needs continued OT Services    Follow Up Recommendations  Home health OT    Barriers to Discharge      Equipment Recommendations  None recommended by OT;Other (comment)    Recommendations for Other Services    Frequency  Min 2X/week    Precautions / Restrictions Precautions Precautions: Back Precaution Comments: after education pt was able to repeat back 3/3 back precauti Required Braces or Orthoses:  (No brace at this time and no order for brace)   Pertinent Vitals/Pain 9 out 10 pain Pushing PCA x3 during session    ADL  Eating/Feeding: Set up Where Assessed - Eating/Feeding: Chair Grooming: Wash/dry face;Wash/dry hands;Min guard Where Assessed - Grooming: Unsupported standing Lower Body Dressing: +1 Total assistance Where Assessed - Lower Body Dressing: Unsupported sitting Toilet Transfer: Minimal assistance Toilet Transfer Method: Sit to stand Toilet Transfer Equipment: Raised toilet seat with arms (or 3-in-1 over toilet) Equipment Used: Gait belt;Rolling walker Transfers/Ambulation Related to ADLs: Pt ambulating with RW and min v/c for hand placement. Pt attempting to abandon RW at sink and educated on ADLs with RW ADL Comments: Pt able to recall back precautions and good demo of bed mobility. Pt reports bed mobility as limiting and deficit task at home. Pt has fiance assistance for LB at home at this time. Pt demonstrates decr activity tolerance, 9 out 10 pain and decr safety awareness with RW    OT  Diagnosis: Generalized weakness;Acute pain  OT Problem List: Decreased strength;Decreased activity tolerance;Impaired balance (sitting and/or standing);Decreased safety awareness;Decreased knowledge of precautions;Decreased knowledge of use of DME or AE;Pain OT Treatment Interventions: Self-care/ADL training;Therapeutic exercise;DME and/or AE instruction;Therapeutic activities;Patient/family education;Balance training   OT Goals Acute Rehab OT Goals OT Goal Formulation: With patient Time For Goal Achievement: 05/26/12 Potential to Achieve Goals: Good ADL Goals Pt Will Perform Grooming: with modified independence;Standing at sink ADL Goal: Grooming - Progress: Goal set today Pt Will Perform Lower Body Bathing: with modified independence;Sit to stand from chair;with adaptive equipment ADL Goal: Lower Body Bathing - Progress: Goal set today Pt Will Perform Lower Body Dressing: with modified independence;Sit to stand from chair;with adaptive equipment ADL Goal: Lower Body Dressing - Progress: Goal set today Pt Will Transfer to Toilet: with modified independence;Ambulation;3-in-1 ADL Goal: Toilet Transfer - Progress: Goal set today Miscellaneous OT Goals Miscellaneous OT Goal #1: Pt will complete bed mobility Mod I as precursor for adl OT Goal: Miscellaneous Goal #1 - Progress: Goal set today  Visit Information  Last OT Received On: 05/12/12 Assistance Needed: +1 PT/OT Co-Evaluation/Treatment: Yes    Subjective Data  Subjective: "it takes 2 1/2 to 3 hours to get home from here' Patient Stated Goal: to be able to sit up again   Prior Functioning     Home Living Lives With: Significant other;Daughter (fiancee and daughter) Available Help at Discharge: Available 24 hours/day;Family Type of Home: Mobile home Home Access: Stairs to enter Entrance Stairs-Number of Steps: 3 Entrance Stairs-Rails: Right Home Layout: One level Bathroom Shower/Tub: Pension scheme manager:  Standard Bathroom Accessibility: Yes How Accessible: Accessible via walker Home Adaptive Equipment: Straight cane Prior Function Level of Independence: Needs assistance Needs Assistance: Bathing;Dressing;Light Housekeeping Driving: No Vocation: On disability Communication Communication: No difficulties Dominant Hand: Right         Vision/Perception Vision - History Baseline Vision: No visual deficits Patient Visual Report: No change from baseline   Cognition  Cognition Overall Cognitive Status: Appears within functional limits for tasks assessed/performed Arousal/Alertness: Lethargic Cognition - Other Comments: lethargic due to surgical status and likely PCA use    Extremity/Trunk Assessment Right Upper Extremity Assessment RUE ROM/Strength/Tone: Within functional levels;Due to precautions RUE Coordination: WFL - gross/fine motor Left Upper Extremity Assessment LUE ROM/Strength/Tone: Within functional levels;Due to precautions LUE Coordination: WFL - gross/fine motor Right Lower Extremity Assessment RLE ROM/Strength/Tone: Deficits RLE ROM/Strength/Tone Deficits: functionally pt's right leg buckles with gait RLE Sensation: Deficits RLE Sensation Deficits: numbness and tingling right leg Left Lower Extremity Assessment LLE ROM/Strength/Tone: Deficits LLE ROM/Strength/Tone Deficits: grossly 4/5 per functional assessment LLE Sensation: Deficits LLE Sensation Deficits: per pt report in groin/upper thigh numb, but not as bad as right leg.   Trunk Assessment Trunk Assessment: Normal     Mobility Bed Mobility Rolling Right: 4: Min guard Right Sidelying to Sit: 4: Min assist Details for Bed Mobility Assistance: Min v/c for sequence Transfers Transfers: Sit to Stand;Stand to Sit Sit to Stand: 4: Min assist;With upper extremity assist;From bed Stand to Sit: 4: Min assist;With upper extremity assist;To chair/3-in-1 Details for Transfer Assistance: v/c for hand  placement and placement of RW     Exercise     Balance     End of Session OT - End of Session Activity Tolerance: Patient limited by pain Patient left: in chair;with call bell/phone within reach;with family/visitor present Nurse Communication: Mobility status;Precautions  GO     Lucile Shutters 05/12/2012, 9:30 AM Pager: [phone]

## 2012-05-12 NOTE — Progress Notes (Addendum)
Low urine output throughout shift. I have offered patient water several times but refuses. Bladder scanned to ensure placement adequacy of foley catheter with reading showing 0 cc of fluid in pts bladder.  Dr. Venetia Maxon paged and made aware. 500 cc bolus of NS ordered and increase maintenance fluids to 100 cc/hr NS. Will continue to monitor. Jacqulynn Cadet

## 2012-05-12 NOTE — Progress Notes (Signed)
UR completed 

## 2012-05-12 NOTE — Progress Notes (Signed)
Postop day 1. Back pain improved but still significant. Patient also complains of headache and leg pain.  Afebrile currently. MAXIMUM TEMPERATURE 101.3 overnight. Heart rate stable. Blood pressure normal. Urine output marginal. Patient awake and alert. She is oriented and appropriate. Cranial nerve function is intact. Motor and sensory function of the extremities are normal. Followup labs pending. Dressing dry. Drain output low.  Gram stain consistent with gram-positive cocci most likely staph.  Status post I&D of lumbar epidural abscess. Transfer to floor. Continue current antibiotics. Continue Foley for urine output assessment.

## 2012-05-12 NOTE — Evaluation (Signed)
Physical Therapy Co-Evaluation (PT/OT) Patient Details Name: [name] MRN: [id number] DOB: 08/08/1965 Today's Date: 05/12/2012 Time: 1308-6578 PT Time Calculation (min): 29 min  PT Assessment / Plan / Recommendation Clinical Impression  47 y.o. admitted to [facility] from [place] due to incisional drainage, fever.  She is s/p L4/5 lumbar fusion 2 weeks ago and has been at home with increasing incisional pain and drainage, now with fever.  She is s/p operative wound  I&D and closure yesterday 05/11/12.  She presents today in significiant pain in low back and bil legs R>L with decreased sensation in bil legs R>L.  She mobilized well with min assist overall using a RW.  With increased gait distance functionally right leg was weaker and tended to buckle.  She should progress well with mobility and I anticipate home with RW at discharge.  It would not be a bad idea to have a HHPT safety eval since pt reports she was having difficulty with ADLs and mobility at home.      PT Assessment  Patient needs continued PT services    Follow Up Recommendations  Home health PT;Supervision/Assistance - 24 hour    Does the patient have the potential to tolerate intense rehabilitation    NA  Barriers to Discharge None none    Equipment Recommendations  Rolling walker with 5" wheels    Recommendations for Other Services   Na  Frequency Min 5X/week    Precautions / Restrictions Precautions Precautions: Back Precaution Comments: after education pt was able to repeat back 3/3 back precauti Required Braces or Orthoses:  (No brace at this time and no order for brace)   Pertinent Vitals/Pain 9-10/10 pain with mobility in low back incisional site and right leg.  PCA encouraged, pt repositioned and walked.        Mobility  Bed Mobility Rolling Right: 4: Min guard Right Sidelying to Sit: 4: Min assist Details for Bed Mobility Assistance: Min v/c for sequence Transfers Sit to Stand: 4: Min  assist;With upper extremity assist;From bed Stand to Sit: 4: Min assist;With upper extremity assist;To chair/3-in-1 Details for Transfer Assistance: v/c for hand placement and placement of RW Ambulation/Gait Ambulation/Gait Assistance: 4: Min guard Ambulation Distance (Feet): 150 Feet Assistive device: Rolling walker Ambulation/Gait Assistance Details: heavy reliance on hands for support buckling right knee as pt fatigued, verbal cues to stay inside of RW and for upright posture.   Gait Pattern: Step-through pattern;Trunk flexed;Shuffle General Gait Details: pt reporting pain with WB on right leg.         PT Diagnosis: Difficulty walking;Abnormality of gait;Generalized weakness;Acute pain  PT Problem List: Decreased strength;Decreased activity tolerance;Decreased balance;Decreased mobility;Decreased knowledge of use of DME;Decreased knowledge of precautions;Impaired sensation;Pain PT Treatment Interventions: DME instruction;Gait training;Stair training;Functional mobility training;Therapeutic activities;Therapeutic exercise;Balance training;Neuromuscular re-education;Patient/family education   PT Goals Acute Rehab PT Goals PT Goal Formulation: With patient Time For Goal Achievement: 05/26/12 Potential to Achieve Goals: Good Pt will Roll Supine to Right Side: with modified independence PT Goal: Rolling Supine to Right Side - Progress: Goal set today Pt will Roll Supine to Left Side: with modified independence PT Goal: Rolling Supine to Left Side - Progress: Goal set today Pt will go Supine/Side to Sit: with modified independence;with HOB 0 degrees PT Goal: Supine/Side to Sit - Progress: Goal set today Pt will go Sit to Stand: with supervision PT Goal: Sit to Stand - Progress: Goal set today Pt will go Stand to Sit: with supervision PT Goal: Stand to Sit -  Progress: Goal set today Pt will Transfer Bed to Chair/Chair to Bed: with supervision PT Transfer Goal: Bed to Chair/Chair to Bed -  Progress: Goal set today Pt will Ambulate: >150 feet;with supervision;with rolling walker PT Goal: Ambulate - Progress: Goal set today Pt will Go Up / Down Stairs: 3-5 stairs;with min assist;with rail(s) (right rail) PT Goal: Up/Down Stairs - Progress: Goal set today  Visit Information  Last PT Received On: 05/12/12 Assistance Needed: +1 PT/OT Co-Evaluation/Treatment: Yes    Subjective Data  Subjective: Pt report she has had many back surgeries Patient Stated Goal: decrease pain   Prior Functioning  Home Living Lives With: Significant other;Daughter (fiancee and daughter) Available Help at Discharge: Available 24 hours/day;Family Type of Home: Mobile home Home Access: Stairs to enter Entrance Stairs-Number of Steps: 3 Entrance Stairs-Rails: Right Home Layout: One level Bathroom Shower/Tub: Forensic scientist: Standard Bathroom Accessibility: Yes How Accessible: Accessible via walker Home Adaptive Equipment: Straight cane Prior Function Level of Independence: Needs assistance Needs Assistance: Bathing;Dressing;Light Housekeeping Driving: No Vocation: On disability Communication Communication: No difficulties Dominant Hand: Right    Cognition  Cognition Overall Cognitive Status: Appears within functional limits for tasks assessed/performed Arousal/Alertness: Lethargic Cognition - Other Comments: lethargic due to surgical status and likely PCA use    Extremity/Trunk Assessment Right Upper Extremity Assessment RUE ROM/Strength/Tone: Within functional levels;Due to precautions RUE Coordination: WFL - gross/fine motor Left Upper Extremity Assessment LUE ROM/Strength/Tone: Within functional levels;Due to precautions LUE Coordination: WFL - gross/fine motor Right Lower Extremity Assessment RLE ROM/Strength/Tone: Deficits RLE ROM/Strength/Tone Deficits: functionally pt's right leg buckles with gait RLE Sensation: Deficits RLE Sensation Deficits:  numbness and tingling right leg Left Lower Extremity Assessment LLE ROM/Strength/Tone: Deficits LLE ROM/Strength/Tone Deficits: grossly 4/5 per functional assessment LLE Sensation: Deficits LLE Sensation Deficits: per pt report in groin/upper thigh numb, but not as bad as right leg.   Trunk Assessment Trunk Assessment: Normal   Balance Static Sitting Balance Static Sitting - Balance Support: Bilateral upper extremity supported;Feet supported Static Sitting - Level of Assistance: 5: Stand by assistance Static Sitting - Comment/# of Minutes: pt painful seated EOB, WB heavily through arms to [employer] lower back Static Standing Balance Static Standing - Balance Support: Bilateral upper extremity supported;During functional activity Static Standing - Level of Assistance: 4: Min assist Static Standing - Comment/# of Minutes: min assist while standing at sink to brush hair and wash face.  Verbal cues to support herself on RW with one hand, and to stand closer to sink.    End of Session PT - End of Session Equipment Utilized During Treatment: Gait belt Activity Tolerance: Patient limited by fatigue;Patient limited by pain Patient left: in chair;with call bell/phone within reach;with family/visitor present (fiancee) Nurse Communication: Mobility status;Other (comment) (no brace in room)       Lurena Joiner B. Darryl Blumenstein, PT, DPT [phone]   05/12/2012, 9:46 AM

## 2012-05-12 NOTE — Progress Notes (Signed)
Pt c/o of severe pain,on morphine PCA but no breakthrough pain medication ordered,Dr Jones(on call) paged and notified, percocet tabs 1-2 ordered as needed,same commenced at 2219,pt reassured,will continue to monitor. Obasogie-Asidi, Labron Bloodgood Efe

## 2012-05-12 NOTE — Progress Notes (Signed)
05/12/2012 Previous PT notes (from prior hospitalization) indicate that pt was wearing a lumbar corset after previous back surgery.  She states, "I was wearing it in the ambulance" but unable to locate it in her room. RN to contact MD about ordering another brace or going without a brace.  Back precautions maintained during mobility today and no orders after surgery for bracing.   [name], PT, DPT [phone]

## 2012-05-13 ENCOUNTER — Encounter (HOSPITAL_COMMUNITY): Payer: Self-pay | Admitting: Neurosurgery

## 2012-05-13 MED ORDER — VANCOMYCIN HCL 500 MG IV SOLR
500.0000 mg | Freq: Two times a day (BID) | INTRAVENOUS | Status: DC
  Administered 2012-05-13 – 2012-05-14 (×2): 500 mg via INTRAVENOUS
  Filled 2012-05-13 (×3): qty 500

## 2012-05-13 MED ORDER — SODIUM CHLORIDE 0.9 % IJ SOLN
10.0000 mL | INTRAMUSCULAR | Status: DC | PRN
  Administered 2012-05-19: 10 mL

## 2012-05-13 MED ORDER — VANCOMYCIN HCL 10 G IV SOLR
1250.0000 mg | Freq: Three times a day (TID) | INTRAVENOUS | Status: DC
  Administered 2012-05-14 (×2): 1250 mg via INTRAVENOUS
  Filled 2012-05-13 (×3): qty 1250

## 2012-05-13 NOTE — Progress Notes (Signed)
Peripherally Inserted Central Catheter/Midline Placement  The IV Nurse has discussed with the patient and/or persons authorized to consent for the patient, the purpose of this procedure and the potential benefits and risks involved with this procedure.  The benefits include less needle sticks, lab draws from the catheter and patient may be discharged home with the catheter.  Risks include, but not limited to, infection, bleeding, blood clot (thrombus formation), and puncture of an artery; nerve damage and irregular heat beat.  Alternatives to this procedure were also discussed.  PICC/Midline Placement Documentation  PICC / Midline Single Lumen 05/13/12 PICC Right Basilic (Active)       Stacie Glaze Horton 05/13/2012, 12:10 PM

## 2012-05-13 NOTE — Progress Notes (Signed)
Patient still complains of severe back pain. Pain itself is not appear to be radicular in nature however. Headaches or improved.  Afebrile. Vital stable. Awake and alert. Drain output low. Urine output improved. Motor and sensory exam intact. And wound dressing dry. Chest abdomen benign.  Cultures consistent with staph aureus. Sensitivities pending.  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 Status post I and D. of lumbar wound. Plan for prolonged IV antibiotic therapy. At least 6 weeks and appropriate antibiotics depending on culture sensitivities. Mobilize with therapy. Continued PCA for pain control.

## 2012-05-13 NOTE — Progress Notes (Signed)
Physical Therapy Treatment Patient Details Name: [name] MRN: [id number] DOB: 1965-09-27 Today's Date: 05/13/2012 Time: 4098-1191 PT Time Calculation (min): 24 min  PT Assessment / Plan / Recommendation Comments on Treatment Session  Patient s/p I&D of abscess of lumbar spine following lumbar surgery.  Patient limited by pain today, in back and headache.    Follow Up Recommendations  Home health PT;Supervision/Assistance - 24 hour     Does the patient have the potential to tolerate intense rehabilitation     Barriers to Discharge        Equipment Recommendations  Rolling walker with 5" wheels    Recommendations for Other Services    Frequency Min 5X/week   Plan Discharge plan remains appropriate;Frequency remains appropriate    Precautions / Restrictions Precautions Precautions: Back Precaution Comments: Able to recall 3/3 back precautions Required Braces or Orthoses: Spinal Brace Spinal Brace: Lumbar corset;Applied in sitting position (Husband found corsett in car.) Restrictions Weight Bearing Restrictions: No   Pertinent Vitals/Pain Pain 8/10 in back and headache limiting mobility today.    Mobility  Bed Mobility Bed Mobility: Rolling Left;Sit to Sidelying Right Rolling Left: 4: Min guard;With rail Sit to Sidelying Right: 4: Min guard;With rail;HOB flat Details for Bed Mobility Assistance: Verbal cues for moving sit to sidelying. Patient then able to roll onto left side. Transfers Transfers: Sit to Stand;Stand to Sit Sit to Stand: 4: Min guard;With upper extremity assist;From bed Stand to Sit: 4: Min guard;With upper extremity assist;To bed Details for Transfer Assistance: Verbal cues for hand placement and technique.  Patient attempted to look behind her at bed - instructed patient to feel bed with LE's to avoid twisting. Ambulation/Gait Ambulation/Gait Assistance: 4: Min guard Ambulation Distance (Feet): 112 Feet Assistive device: Rolling  walker Ambulation/Gait Assistance Details: Patient with increased pain and headache today, limiting ambulation.  Patient requesting to return to room.  Safe use of RW noted. Gait Pattern: Step-through pattern;Trunk flexed;Shuffle Gait velocity: decreased      PT Goals Acute Rehab PT Goals PT Goal: Rolling Supine to Left Side - Progress: Progressing toward goal PT Goal: Sit to Stand - Progress: Progressing toward goal PT Goal: Stand to Sit - Progress: Progressing toward goal PT Transfer Goal: Bed to Chair/Chair to Bed - Progress: Progressing toward goal PT Goal: Ambulate - Progress: Progressing toward goal  Visit Information  Last PT Received On: 05/13/12 Assistance Needed: +1    Subjective Data  Subjective: "My head is hurting so bad"   Cognition  Cognition Overall Cognitive Status: Appears within functional limits for tasks assessed/performed Arousal/Alertness: Lethargic Orientation Level: Appears intact for tasks assessed Behavior During Session: Lethargic    Balance     End of Session PT - End of Session Equipment Utilized During Treatment: Gait belt;Back brace Activity Tolerance: Patient limited by pain;Patient limited by fatigue Patient left: in bed;with call bell/phone within reach;with family/visitor present Nurse Communication: Mobility status (Significant headache)   GP     Vena Austria 05/13/2012, 3:18 PM Durenda Hurt. Renaldo Fiddler, [facility] Acute Rehab Services Pager [phone]

## 2012-05-13 NOTE — Care Management Note (Signed)
Page 1 of 2   05/19/2012     1:32:26 PM   CARE MANAGEMENT NOTE 05/19/2012  Patient:  [name], [name]   Account Number:  [id number]  Date Initiated:  05/13/2012  Documentation initiated by:  [facility]  Subjective/Objective Assessment:   admitted with fever, headache, possible incision infection     Action/Plan:   PT/OT evals  IV antibiotics at home   Anticipated DC Date:  05/19/2012   Anticipated DC Plan:  HOME W HOME HEALTH SERVICES      DC Planning Services  CM consult      Choice offered to / List presented to:  C-1 Patient   DME arranged  Levan Hurst      DME agency  [facility]     [facility] arranged  HH-1 RN  HH-2 PT  HH-3 OT      Status of service:  Completed, signed off Medicare Important Message given?   (If response is "NO", the following Medicare IM given date fields will be blank) Date Medicare IM given:   Date Additional Medicare IM given:    Discharge Disposition:  HOME W HOME HEALTH SERVICES  Per UR Regulation:  Reviewed for med. necessity/level of care/duration of stay  If discussed at Long Length of Stay Meetings, dates discussed:    Comments:  PCP [name] [phone] Pamela Foster PA, Dr. Egbert Garibaldi  05/19/12 Received call from [facility] [name], they will be able to service the patient, per [facility], Vancomycin and supplies will be covered at 90%.Informed her that patient has been discharged today. They will be able to service the patient this evening for 7pm dose.They would like to see patient at 5pm to admit her.Contact at [facility] is [name] at [phone].Contacted [name] at [facility] and verified that they would be able to service the patient at 7pm this evening. She requested latest vancomycin lab fax # [phone]. Faxed lastest vancomycin level and verified receipt with [name] and informed her that dose had been decreased since that result. Informed patient of 90% coverage on med. Gave patient contact phone # for  [facility] and [facility]. Patient is planning to be home by 5pm for evening vancomycin dose. I faxed Hand P and d/c summary to Renard Matter and notifed office of the fax, [name] was unavailable. Pamela Cree RN, BSN, CCM   05/18/12 Spoke with patient about HHC antibiotics, her daughter who is 22yo and lives with her will learn to give antiboitics. She is agreeable with [facility]. Contacted [facility], spoke with Scheryl Marten, she will fax the antibiotic order to [facility] after I fax it to her. Obtained Iv vancomycin order from Dr. Jordan Likes, faxed order, face sheet, HHC order, face to face, H and P, PT eval and OT eval to [phone], and confirmed receipt with [facility]. She will notify me as to whether they will take the case by 4pm today. Will continue to follow. Pamela Cree RN, BSN, CCM  05/13/12 Spoke with patient about HHC for IV antibiotics, she is not familiar with any HHC agencies in [place] area. Found listing for [facility] [phone]. Contacted [facility], they can provide HHRN for IV antibiotics and HHPT.They work with [facility] [phone]. They will require a MD in [facility] to sign off on HHC orders. Contacted [facility] and spoke with [name]. She checked with Dr. Egbert Garibaldi and he is agreeable to signing HHC orders. Their fax # is [phone]. Dr. Egbert Garibaldi is familiar with [facility].  CM will continue to follow to set up HHC needs. Pamela Cree RN, BSN, CCM

## 2012-05-13 NOTE — Progress Notes (Signed)
ANTIBIOTIC CONSULT NOTE-PROGRESS NOTE   Pharmacy Consult for Vancomycin Indication: Lumbar wound infection  Hospital Problems Principal Problem:   Abscess in epidural space of lumbar spine  Allergies Allergies  Allergen Reactions  . Ciprofloxacin Nausea And Vomiting    Patient Measurements: Height: 5\' 5"  (165.1 cm) Weight: 174 lb 14.4 oz (79.334 kg)  Vital Signs: BP 116/64  Pulse 52  Temp(Src) 98.1 F (36.7 C) (Oral)  Resp 18  Ht 5\' 5"  (1.651 m)  Wt 174 lb 14.4 oz (79.334 kg)  BMI 29.1 kg/m2  SpO2 96%  Labs:  Recent Labs  05/11/12 0635  WBC 16.2*  HGB 10.7*  PLT 155  CREATININE 0.76   Estimated Creatinine Clearance: 90.4 ml/min (by C-G formula based on Cr of 0.76).    Vancomycin trough 11.1 mcg/ml  Anti-infectives Anti-infectives   Start     Dose/Rate Route Frequency Ordered Stop   05/13/12 1030  cefTRIAXone (ROCEPHIN) 2 g in dextrose 5 % 50 mL IVPB     2 g 100 mL/hr over 30 Minutes Intravenous Every 24 hours 05/12/12 1031     05/12/12 0200  vancomycin (VANCOCIN) 750 mg in sodium chloride 0.9 % 150 mL IVPB     750 mg 150 mL/hr over 60 Minutes Intravenous Every 8 hours 05/11/12 2055       Assessment:  47 y/o female with history of a recent lumbar fusion currently on Vancomycin for Staph Aureus abscess at wound site.  Vancomycin trough reported at 11.1 mcg/ml, below therapeutic goal.  Goal of Therapy:   Vancomycin trough level 15-20 mcg/ml  Plan:   Give additional Vancomycin 500 mg IV x 1, then increase Vancomycin to 1250 mg IV q 8 hours.  Follow up Vancomycin levels as indicated.  Laurena Bering, Pharm.D.  05/13/2012 6:14 PM

## 2012-05-14 LAB — WOUND CULTURE

## 2012-05-14 MED ORDER — MORPHINE SULFATE (PF) 1 MG/ML IV SOLN
INTRAVENOUS | Status: DC
  Administered 2012-05-14: 20:00:00 via INTRAVENOUS
  Administered 2012-05-15: 28.45 mg via INTRAVENOUS
  Administered 2012-05-15: 17 mg via INTRAVENOUS
  Administered 2012-05-15: 9 mg via INTRAVENOUS
  Administered 2012-05-15: 12 mg via INTRAVENOUS
  Administered 2012-05-15: 13:00:00 via INTRAVENOUS
  Administered 2012-05-15: 7 mg via INTRAVENOUS
  Administered 2012-05-16: 07:00:00 via INTRAVENOUS
  Administered 2012-05-16: 7 mg via INTRAVENOUS
  Administered 2012-05-16: 14 mg via INTRAVENOUS
  Administered 2012-05-16: 11 mg via INTRAVENOUS
  Administered 2012-05-16: 3 mg via INTRAVENOUS
  Administered 2012-05-16: 31.73 mg via INTRAVENOUS
  Administered 2012-05-16: 16 mg via INTRAVENOUS
  Administered 2012-05-17: 12 mg via INTRAVENOUS
  Administered 2012-05-17: 9 mg via INTRAVENOUS
  Administered 2012-05-17: 22:00:00 via INTRAVENOUS
  Administered 2012-05-17: 20 mg via INTRAVENOUS
  Administered 2012-05-17: 16 mg via INTRAVENOUS
  Administered 2012-05-17: 5 mg via INTRAVENOUS
  Administered 2012-05-18: 16 mg via INTRAVENOUS
  Administered 2012-05-18: 14 mg via INTRAVENOUS
  Filled 2012-05-14 (×9): qty 25

## 2012-05-14 MED ORDER — NALOXONE HCL 0.4 MG/ML IJ SOLN: 0.4000 mg | INTRAMUSCULAR | Status: DC | PRN

## 2012-05-14 MED ORDER — METHOCARBAMOL 500 MG PO TABS
500.0000 mg | ORAL_TABLET | Freq: Four times a day (QID) | ORAL | Status: DC | PRN
  Administered 2012-05-14 – 2012-05-18 (×5): 500 mg via ORAL
  Filled 2012-05-14 (×5): qty 1

## 2012-05-14 MED ORDER — MORPHINE SULFATE (PF) 1 MG/ML IV SOLN
INTRAVENOUS | Status: DC
  Filled 2012-05-14: qty 25

## 2012-05-14 MED ORDER — MORPHINE SULFATE 2 MG/ML IJ SOLN
2.0000 mg | INTRAMUSCULAR | Status: DC | PRN
  Administered 2012-05-14 (×2): 4 mg via INTRAVENOUS
  Filled 2012-05-14 (×2): qty 2

## 2012-05-14 MED ORDER — HYDROMORPHONE HCL 2 MG PO TABS
4.0000 mg | ORAL_TABLET | ORAL | Status: DC | PRN
  Administered 2012-05-14 – 2012-05-19 (×27): 4 mg via ORAL
  Filled 2012-05-14 (×27): qty 2

## 2012-05-14 MED ORDER — DIPHENHYDRAMINE HCL 50 MG/ML IJ SOLN: 12.5000 mg | Freq: Four times a day (QID) | INTRAMUSCULAR | Status: DC | PRN

## 2012-05-14 MED ORDER — GABAPENTIN 600 MG PO TABS
600.0000 mg | ORAL_TABLET | Freq: Three times a day (TID) | ORAL | Status: DC
  Administered 2012-05-14 – 2012-05-19 (×15): 600 mg via ORAL
  Filled 2012-05-14 (×17): qty 1

## 2012-05-14 MED ORDER — MORPHINE SULFATE (PF) 1 MG/ML IV SOLN
INTRAVENOUS | Status: AC
  Administered 2012-05-14: 1 mg via INTRAVENOUS
  Filled 2012-05-14: qty 25

## 2012-05-14 MED ORDER — SODIUM CHLORIDE 0.9 % IJ SOLN: 9.0000 mL | INTRAMUSCULAR | Status: DC | PRN

## 2012-05-14 MED ORDER — DIPHENHYDRAMINE HCL 12.5 MG/5ML PO ELIX: 12.5000 mg | ORAL_SOLUTION | Freq: Four times a day (QID) | ORAL | Status: DC | PRN

## 2012-05-14 MED ORDER — SODIUM CHLORIDE 0.9 % IV SOLN
1250.0000 mg | Freq: Three times a day (TID) | INTRAVENOUS | Status: DC
  Administered 2012-05-15 – 2012-05-17 (×7): 1250 mg via INTRAVENOUS
  Filled 2012-05-14 (×9): qty 1250

## 2012-05-14 MED ORDER — METHOCARBAMOL 100 MG/ML IJ SOLN: 500.0000 mg | Freq: Four times a day (QID) | INTRAVENOUS | Status: DC | PRN

## 2012-05-14 NOTE — Progress Notes (Signed)
Physical Therapy Treatment Patient Details Name: [name] MRN: [id number] DOB: 12-May-1965 Today's Date: 05/14/2012 Time: 4098-1191 PT Time Calculation (min): 12 min  PT Assessment / Plan / Recommendation Comments on Treatment Session  Pt cont's to c/o significant back & Rt groin pain.      Follow Up Recommendations  Home health PT;Supervision/Assistance - 24 hour     Does the patient have the potential to tolerate intense rehabilitation     Barriers to Discharge        Equipment Recommendations  Rolling walker with 5" wheels    Recommendations for Other Services    Frequency Min 5X/week   Plan Discharge plan remains appropriate;Frequency remains appropriate    Precautions / Restrictions Precautions Precautions: Back Precaution Comments: Able to recall 3/3 back precautions Required Braces or Orthoses: Spinal Brace Spinal Brace: Lumbar corset;Applied in sitting position   Pertinent Vitals/Pain 9/10 back & Rt groin.  Premedicated.      Mobility  Bed Mobility Bed Mobility: Not assessed Transfers Transfers: Sit to Stand;Stand to Sit Sit to Stand: 4: Min guard;With upper extremity assist;From bed Stand to Sit: 4: Min guard;With upper extremity assist;To bed Details for Transfer Assistance: cues for safest hand placement.  Bed elevated for stand>sit to increased comfort for pt.   Ambulation/Gait Ambulation/Gait Assistance: 4: Min guard Ambulation Distance (Feet): 140 Feet Assistive device: Rolling walker Ambulation/Gait Assistance Details: Pt relying heavily on RW with UE"s due to pain however uses RW safely.   Pt deferred stair training due to pain.   Gait Pattern: Step-through pattern;Decreased stride length (decreased step height) Gait velocity: decreased General Gait Details: pt reporting pain with WB on right leg.  Stairs: No      PT Goals Acute Rehab PT Goals Time For Goal Achievement: 05/26/12 Potential to Achieve Goals: Good Pt will Roll Supine to  Right Side: with modified independence Pt will Roll Supine to Left Side: with modified independence Pt will go Supine/Side to Sit: with modified independence;with HOB 0 degrees Pt will go Sit to Stand: with supervision PT Goal: Sit to Stand - Progress: Progressing toward goal Pt will go Stand to Sit: with supervision PT Goal: Stand to Sit - Progress: Progressing toward goal Pt will Transfer Bed to Chair/Chair to Bed: with supervision Pt will Ambulate: >150 feet;with supervision;with rolling walker PT Goal: Ambulate - Progress: Progressing toward goal Pt will Go Up / Down Stairs: 3-5 stairs;with min assist;with rail(s)  Visit Information  Last PT Received On: 05/14/12 Assistance Needed: +1    Subjective Data  Subjective: "It hurts so bad"   Cognition  Cognition Overall Cognitive Status: Appears within functional limits for tasks assessed/performed Arousal/Alertness: Awake/alert Orientation Level: Appears intact for tasks assessed Behavior During Session: [facility] for tasks performed    Balance     End of Session PT - End of Session Equipment Utilized During Treatment: Gait belt;Back brace Activity Tolerance: Patient limited by pain Patient left: in bed;with family/visitor present (sitting EOB per pt's request to eat breakfast) Nurse Communication: Mobility status     Verdell Face, [place] [phone] 05/14/2012

## 2012-05-14 NOTE — Progress Notes (Signed)
MEDICATION RELATED Pharmacy Re:  Methocarbamol IV Indication: Back - ordered product   Methocarbamol (Robaxin) 500mg  IV every 6 hours ordered as needed for muscle spasms.  This medication is currently on national back-order and we cannot obtain the IV formulation.    Plan:  I have changed this order to provide the oral formulation since this patient can take oral medication, however, should you feel she needs IV, please consider alternative.  Thanks,  Nadara Mustard, PharmD., MS Clinical Pharmacist Pager:  [phone] Thank you for allowing pharmacy to be part of this patients care team. 05/14/2012,11:53 AM

## 2012-05-14 NOTE — Progress Notes (Signed)
Patient changed to 4mg  dilaudid po q 4.  She had dose several hours ago and is still complaining of pain 8/9 out of 1o.  Paged Dr. Lovell Sheehan for orders.  Will continue to monitor.  Lance Bosch, RN

## 2012-05-14 NOTE — Progress Notes (Signed)
Subjective: Patient reports -year-old outpatient 7 lateral right hip and lateral thigh pain seems to follow more than L4 nerve root pattern. Otherwise no numbness or tingling or legs or feet  Objective: Vital signs in last 24 hours: Temp:  [97.9 F (36.6 C)-99.9 F (37.7 C)] 98.1 F (36.7 C) (02/22 0522) Pulse Rate:  [52-62] 58 (02/22 0522) Resp:  [10-20] 10 (02/22 0759) BP: (109-129)/(54-68) 118/68 mmHg (02/22 0522) SpO2:  [91 %-99 %] 99 % (02/22 0759)  Intake/Output from previous day: 02/21 0701 - 02/22 0700 In: 9222.5 [I.V.:8522.5; IV Piggyback:700] Out: 3450 [Urine:3400; Drains:50] Intake/Output this shift: Total I/O In: 4348.3 [I.V.:3998.3; IV Piggyback:350] Out: -   Strength out of 5 wound is clean and dry  Lab Results: No results found for this basename: WBC, HGB, HCT, PLT,  in the last 72 hours BMET No results found for this basename: NA, K, CL, CO2, GLUCOSE, BUN, CREATININE, CALCIUM,  in the last 72 hours  Studies/Results: No results found.  Assessment/Plan: Postop day 3 from a I&D of lumbar wound patient seems to be doing okay she is afebrile her drain output is only been 50 cc of the last shift she maintains on bank and Rocephin cultures were positive for staph aureus we'll continue the antibiotics and all change her pain management around DC her PCA put her on oral Dilaudid  LOS: 3 days     [name] P 05/14/2012, 8:31 AM

## 2012-05-14 NOTE — Progress Notes (Signed)
Patient in a lot of pain today and was crying earlier saying she "could not take it anymore."  Spoke with Dr. Lovell Sheehan at length who restarted a full dose morphine PCA, 600 neurontin TID, prn 4 mg dilaudid, robaxin and scheduled 10mg  valium.  Patient is more comfortable than earlier, but still in pain.  Provided a lot of emotional support to her and family and will continue to monitor.  Lance Bosch, RN

## 2012-05-14 NOTE — Progress Notes (Signed)
Occupational Therapy Treatment Patient Details Name: [name] MRN: [id number] DOB: 1966-02-05 Today's Date: 05/14/2012 Time: 4098-1191 OT Time Calculation (min): 33 min  OT Assessment / Plan / Recommendation Comments on Treatment Session Pt limited by severe pain, but is willing to participate.  She demonstrates good understanding of back precautions, and will benefit from AE for LB ADLs    Follow Up Recommendations  Home health OT    Barriers to Discharge       Equipment Recommendations  None recommended by OT;Other (comment)    Recommendations for Other Services    Frequency Min 2X/week   Plan Discharge plan remains appropriate    Precautions / Restrictions Precautions Precautions: Back Precaution Booklet Issued: Yes (comment) Precaution Comments: Able to recall 3/3 back precautions Required Braces or Orthoses: Spinal Brace Spinal Brace: Lumbar corset;Applied in sitting position Restrictions Weight Bearing Restrictions: No   Pertinent Vitals/Pain     ADL  Lower Body Dressing: Moderate assistance Where Assessed - Lower Body Dressing: Supported sit to stand Toilet Transfer: Hydrographic surveyor Method: Sit to Barista: Comfort height toilet;Grab bars Toileting - Architect and Hygiene: Min guard Where Assessed - Engineer, mining and Hygiene: Standing Equipment Used: Rolling walker;Back brace Transfers/Ambulation Related to ADLs: Pt ambulates with RW and min guard assist ADL Comments: Pt instructed in use of AE - she is familiar with AE from previous admission.  Pt reports fiance' has been assisting her, but will be returning to work, so she will likely need AE.  Pt demonstrates understanding of use, but is limited with LB ADLs due to severe pain    OT Diagnosis:    OT Problem List:   OT Treatment Interventions:     OT Goals Acute Rehab OT Goals Time For Goal Achievement: 05/26/12 ADL Goals ADL Goal:  Lower Body Dressing - Progress: Progressing toward goals ADL Goal: Toilet Transfer - Progress: Progressing toward goals Miscellaneous OT Goals OT Goal: Miscellaneous Goal #1 - Progress: Progressing toward goals  Visit Information  Last OT Received On: 05/14/12 Assistance Needed: +1    Subjective Data      Prior Functioning       Cognition  Cognition Overall Cognitive Status: Appears within functional limits for tasks assessed/performed Arousal/Alertness: Awake/alert Orientation Level: Appears intact for tasks assessed Behavior During Session: [facility] for tasks performed    Mobility  Bed Mobility Bed Mobility: Right Sidelying to Sit;Sitting - Scoot to Edge of Bed;Sit to Sidelying Right Right Sidelying to Sit: 5: Supervision;With rails;HOB elevated Sitting - Scoot to Edge of Bed: 5: Supervision Sit to Sidelying Right: 4: Min assist;HOB elevated;With rail Details for Bed Mobility Assistance: assist to lift LEs onto bed Transfers Transfers: Sit to Stand;Stand to Sit Sit to Stand: 4: Min guard;With upper extremity assist;From bed;From toilet Stand to Sit: 4: Min guard;With upper extremity assist;To bed;To toilet Details for Transfer Assistance: min guard due to severity of pain    Exercises      Balance     End of Session OT - End of Session Equipment Utilized During Treatment: Back brace Activity Tolerance: Patient limited by pain Patient left: in bed;with call bell/phone within reach;with family/visitor present Nurse Communication: Mobility status  GO     Jeani Hawking M 05/14/2012, 12:57 PM

## 2012-05-15 MED ORDER — PNEUMOCOCCAL VAC POLYVALENT 25 MCG/0.5ML IJ INJ
0.5000 mL | INJECTION | INTRAMUSCULAR | Status: AC
  Administered 2012-05-16: 0.5 mL via INTRAMUSCULAR
  Filled 2012-05-15: qty 0.5

## 2012-05-15 NOTE — Progress Notes (Addendum)
Patient ID: [name], female   DOB: December 02, 1965, 47 y.o.   MRN: [id number] Subjective:  The patient is alert and pleasant. I spoke with her husband. She feels much better today.  Objective: Vital signs in last 24 hours: Temp:  [98.1 F (36.7 C)-100.8 F (38.2 C)] 99.5 F (37.5 C) (02/23 0537) Pulse Rate:  [67-83] 73 (02/23 0537) Resp:  [9-22] 10 (02/23 0827) BP: (98-128)/(58-86) 117/66 mmHg (02/23 0537) SpO2:  [93 %-100 %] 95 % (02/23 0827)  Intake/Output from previous day: 02/22 0701 - 02/23 0700 In: 4348.3 [I.V.:3998.3; IV Piggyback:350] Out: 80 [Drains:80] Intake/Output this shift:    Physical exam patient is alert and pleasant. Her strength is grossly normal in her lower extremities. I removed her Hemovac drain as it was occluded.  Lab Results: No results found for this basename: WBC, HGB, HCT, PLT,  in the last 72 hours BMET No results found for this basename: NA, K, CL, CO2, GLUCOSE, BUN, CREATININE, CALCIUM,  in the last 72 hours  Studies/Results: No results found.  Assessment/Plan: Postop day #4: We'll continue to mobilize the patient she is on antibiotics. We are awaiting the culture results.  LOS: 4 days     Myrle Wanek D 05/15/2012, 10:56 AM      The patient's cultures came back consistent with methicillin-resistant staph aureus.

## 2012-05-15 NOTE — Progress Notes (Signed)
Patient had a better day today regarding her pain than yesterday.  She still complains of pain 7/8 out of 10 but was not seen crying and her attitude was improved.  She asked to get up and eat dinner in the chair and sat there for about 30 minutes.  She is still requiring quite a bit of pain medication both PCA and oral.  I re-taped her dressing and the incision was clean, dry, and intact and was free from erythema and was not edematous.  Will continue to monitor.  Lance Bosch, RN

## 2012-05-16 MED ORDER — RIFAMPIN 300 MG PO CAPS
300.0000 mg | ORAL_CAPSULE | Freq: Two times a day (BID) | ORAL | Status: DC
  Administered 2012-05-17 – 2012-05-19 (×6): 300 mg via ORAL
  Filled 2012-05-16 (×8): qty 1

## 2012-05-16 NOTE — Progress Notes (Signed)
Occupational Therapy Treatment Patient Details Name: [name] MRN: [id number] DOB: 1965-07-23 Today's Date: 05/16/2012 Time: 4098-1191 OT Time Calculation (min): 35 min  OT Assessment / Plan / Recommendation Comments on Treatment Session Pt continues to have increased pain, moves slowly.  Practiced tub transfer.  Pt is knowledgeble in back precautions and AE.  Would benefit from tub seat with a back.    Follow Up Recommendations  Home health OT    Barriers to Discharge       Equipment Recommendations  Tub/shower seat (with a back if covered by her Medicaid in [facility])    Recommendations for Other Services    Frequency Min 2X/week   Plan Discharge plan remains appropriate    Precautions / Restrictions Precautions Precautions: Back;Fall Precaution Comments: Able to recall 3/3 back precautions with verbal cues for arching. Required Braces or Orthoses: Spinal Brace Spinal Brace: Lumbar corset;Applied in sitting position Restrictions Weight Bearing Restrictions: No   Pertinent Vitals/Pain 8/10 back repositioned, premedicated, PCA    ADL  Lower Body Bathing:  (instructed in use of long sponge and reacher) Where Assessed - Lower Body Bathing: Unsupported sitting Upper Body Dressing: Set up (back brace) Where Assessed - Upper Body Dressing: Unsupported sitting Lower Body Dressing:  (instructed pt in use of AE) Where Assessed - Lower Body Dressing: Unsupported sitting Tub/Shower Transfer: Minimal assistance Tub/Shower Transfer Method: Ambulating Tub/Shower Transfer Equipment: Shower seat with back Equipment Used: Rolling walker;Back brace;Sock aid;Reacher;Long-handled sponge;Long-handled shoe horn Transfers/Ambulation Related to ADLs: supervision with RW ADL Comments: Pt is aware of AE and where she may purchase. Needs a shower seat with back if covered by insurance.    OT Diagnosis:    OT Problem List:   OT Treatment Interventions:     OT Goals ADL Goals Pt Will Perform  Grooming: with modified independence;Standing at sink Pt Will Perform Lower Body Bathing: with modified independence;Sit to stand from chair;with adaptive equipment Pt Will Perform Lower Body Dressing: with modified independence;Sit to stand from chair;with adaptive equipment Pt Will Transfer to Toilet: with modified independence;Ambulation;3-in-1 Pt Will Perform Tub/Shower Transfer: Tub transfer;with min assist;Ambulation;with DME;Shower seat with back ADL Goal: Web designer - Progress: Met Miscellaneous OT Goals Miscellaneous OT Goal #1: Pt will complete bed mobility Mod I as precursor for adl OT Goal: Miscellaneous Goal #1 - Progress: Met  Visit Information  Last OT Received On: 05/16/12 Assistance Needed: +1 PT/OT Co-Evaluation/Treatment: Yes (pt with low BP this a.m.)    Subjective Data      Prior Functioning       Cognition  Cognition Overall Cognitive Status: Appears within functional limits for tasks assessed/performed Arousal/Alertness: Awake/alert Orientation Level: Appears intact for tasks assessed Behavior During Session: [facility] for tasks performed    Mobility  Bed Mobility Bed Mobility: Rolling Right;Right Sidelying to Sit;Sitting - Scoot to Edge of Bed Rolling Right: 6: Modified independent (Device/Increase time) Right Sidelying to Sit: 6: Modified independent (Device/Increase time);With rails Sitting - Scoot to Edge of Bed: 6: Modified independent (Device/Increase time) Transfers Transfers: Sit to Stand;Stand to Sit Sit to Stand: 5: Supervision;With upper extremity assist;From bed Stand to Sit: 5: Supervision;With upper extremity assist;To chair/3-in-1    Exercises      Balance     End of Session OT - End of Session Activity Tolerance: Patient limited by pain Patient left: in chair;with call bell/phone within reach;with family/visitor present  GO     Evern Bio 05/16/2012, 10:50 AM [phone]

## 2012-05-16 NOTE — Progress Notes (Signed)
Patient afebrile. Complains of worse back pain again today. Still having some discomfort in her right groin worrisome for right-sided psoas irritation/inflammation. No other problems.  Awake and alert. Oriented and appropriate. Cranial nerve function intact. Motor examination intact. Healing well. We will less tender. No erythema. Still with some pain with straight leg raising on the right much less pain on the left.  I reviewed her previous MRI scan again. This does show some high signal within a right-sided psoas muscle. I am worried that she may progress to some degree of abscess formation in the right psoas muscle. She is on appropriate antibiotics. If her symptoms continue to will send her she begins to run fevers and I think repeating an MRI scan would be appropriate to rule out psoas abscess and to evaluate for possible drainage. Otherwise continue efforts at mobilization. 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 Plan to continue vancomycin and I've added oral rifampin for synergy.

## 2012-05-16 NOTE — Progress Notes (Signed)
Physical Therapy Treatment Patient Details Name: [name] MRN: [id number] DOB: February 07, 1966 Today's Date: 05/16/2012 Time: 2956-2130 PT Time Calculation (min): 37 min  PT Assessment / Plan / Recommendation Comments on Treatment Session  Pt con't to have significant back pain however is mobilizing well. Pt safe to d/c home with assist of spouse when medically appropriate. Pt able to recall back precautions and don brace appropriately.    Follow Up Recommendations  Home health PT;Supervision/Assistance - 24 hour     Does the patient have the potential to tolerate intense rehabilitation     Barriers to Discharge        Equipment Recommendations  Rolling walker with 5" wheels    Recommendations for Other Services    Frequency Min 5X/week   Plan Discharge plan remains appropriate;Frequency remains appropriate    Precautions / Restrictions Precautions Precautions: Back;Fall Precaution Booklet Issued: Yes (comment) Precaution Comments: Able to recall 3/3 back precautions with verbal cues for arching. Required Braces or Orthoses: Spinal Brace Spinal Brace: Lumbar corset;Applied in sitting position Restrictions Weight Bearing Restrictions: No   Pertinent Vitals/Pain 8/10 surgical back pain    Mobility  Bed Mobility Bed Mobility: Rolling Right;Right Sidelying to Sit;Sitting - Scoot to Edge of Bed Rolling Right: 6: Modified independent (Device/Increase time) Right Sidelying to Sit: 6: Modified independent (Device/Increase time);With rails Sitting - Scoot to Edge of Bed: 6: Modified independent (Device/Increase time) Details for Bed Mobility Assistance: increased time due to pain Transfers Transfers: Sit to Stand;Stand to Sit Sit to Stand: 5: Supervision;With upper extremity assist;From bed Stand to Sit: 5: Supervision;With upper extremity assist;To chair/3-in-1 Details for Transfer Assistance: increased time Ambulation/Gait Ambulation/Gait Assistance: 4: Min  guard Ambulation Distance (Feet): 100 Feet Assistive device: Rolling walker Ambulation/Gait Assistance Details: cautious, guarded, increased UE support, v/c's to stay in walker to limit trunk flex Gait Pattern: Step-through pattern;Decreased stride length Gait velocity: decreased Stairs: Yes Stairs Assistance: 3: Mod assist Stairs Assistance Details (indicate cue type and reason): modA via HHA to mimic no handrail at home Stair Management Technique: Step to pattern (hand held assist) Number of Stairs: 2    Exercises     PT Diagnosis:    PT Problem List:   PT Treatment Interventions:     PT Goals Acute Rehab PT Goals PT Goal: Rolling Supine to Right Side - Progress: Progressing toward goal PT Goal: Supine/Side to Sit - Progress: Progressing toward goal PT Goal: Sit to Stand - Progress: Progressing toward goal PT Goal: Stand to Sit - Progress: Progressing toward goal PT Transfer Goal: Bed to Chair/Chair to Bed - Progress: Progressing toward goal PT Goal: Ambulate - Progress: Progressing toward goal PT Goal: Up/Down Stairs - Progress: Progressing toward goal  Visit Information  Last PT Received On: 05/16/12 Assistance Needed: +1 PT/OT Co-Evaluation/Treatment: Yes    Subjective Data  Subjective: pt received supine in bed with c/o 8/10 pain but agreeable to PT.   Cognition  Cognition Overall Cognitive Status: Appears within functional limits for tasks assessed/performed Arousal/Alertness: Awake/alert Orientation Level: Appears intact for tasks assessed Behavior During Session: [facility] for tasks performed    Balance     End of Session PT - End of Session Equipment Utilized During Treatment: Gait belt;Back brace Activity Tolerance: Patient limited by pain Patient left: in chair;with call bell/phone within reach;with family/visitor present Nurse Communication: Mobility status   GP     Marcene Brawn 05/16/2012, 12:19 PM  Lewis Shock, PT, DPT Pager #:  [phone] Office #: [phone]

## 2012-05-16 NOTE — Progress Notes (Addendum)
Call from NT patient BP with dinamap was 82/43, hr 60. Patient asymptomatic and has no complaints related to BP low. Manual BP check 96/60. HR 72 per continuous oxygen sensor with PCA. Up to [facility] with no complaints of dizziness or light headedness. Will continue to monitor patient and Vitals. 10pm lopressor was held due to SBP being in 90s all day.  Olevia Perches

## 2012-05-17 LAB — BASIC METABOLIC PANEL
CO2: 30 mEq/L (ref 19–32)
Calcium: 9 mg/dL (ref 8.4–10.5)
GFR calc non Af Amer: >90 mL/min (ref >90)
Glucose, Bld: 89 mg/dL (ref 70–99)
Potassium: 3 mEq/L — ABNORMAL LOW (ref 3.5–5.1)
Sodium: 139 mEq/L (ref 135–145)

## 2012-05-17 LAB — VANCOMYCIN, TROUGH: Vancomycin Tr: 26.7 ug/mL (ref 10.0–20.0)

## 2012-05-17 MED ORDER — VANCOMYCIN HCL 10 G IV SOLR
1250.0000 mg | Freq: Three times a day (TID) | INTRAVENOUS | Status: AC
  Administered 2012-05-17: 1250 mg via INTRAVENOUS
  Filled 2012-05-17: qty 1250

## 2012-05-17 MED ORDER — VANCOMYCIN HCL IN DEXTROSE 1-5 GM/200ML-% IV SOLN
1000.0000 mg | Freq: Three times a day (TID) | INTRAVENOUS | Status: DC
  Administered 2012-05-17 – 2012-05-19 (×6): 1000 mg via INTRAVENOUS
  Filled 2012-05-17 (×7): qty 200

## 2012-05-17 NOTE — Progress Notes (Signed)
Feeling some better today. More comfortable with better pain control. No new problems overnight.  She is afebrile. Her vitals are stable. Her wound continues to heal well. Motor examination stable. Chest and abdomen benign.  Laboratory studies acceptable except her vancomycin level is high.  Continue vancomycin and rifampin. Mobilize as tolerated. We'll try to stop PCA tomorrow and move towards oral pain control. Tentatively working towards discharge Thursday or Friday.

## 2012-05-17 NOTE — Progress Notes (Signed)
Informed the critical lab value of Vancomycin Tr 26.7 to Dr.Pool and received the order to hold the 10 am dose till clarify with the pharmacy. Pharmacy changed the the dosage and timing.  Danne Harbor 05/17/12

## 2012-05-18 MED ORDER — OXYCODONE HCL 5 MG PO TABS
15.0000 mg | ORAL_TABLET | ORAL | Status: DC | PRN
  Administered 2012-05-18: 20 mg via ORAL
  Administered 2012-05-18 – 2012-05-19 (×4): 30 mg via ORAL
  Filled 2012-05-18: qty 6
  Filled 2012-05-18: qty 4
  Filled 2012-05-18: qty 2
  Filled 2012-05-18: qty 6
  Filled 2012-05-18: qty 4
  Filled 2012-05-18: qty 6

## 2012-05-18 MED ORDER — HYDROMORPHONE HCL PF 1 MG/ML IJ SOLN: 1.0000 mg | INTRAMUSCULAR | Status: DC | PRN

## 2012-05-18 MED ORDER — FENTANYL 25 MCG/HR TD PT72
100.0000 ug | MEDICATED_PATCH | TRANSDERMAL | Status: DC
  Administered 2012-05-18: 100 ug via TRANSDERMAL
  Filled 2012-05-18: qty 4

## 2012-05-18 NOTE — Progress Notes (Signed)
Occupational Therapy Treatment Patient Details Name: [name] MRN: [id number] DOB: [birth year] Today's Date: 05/18/2012 Time: 4098-1191 OT Time Calculation (min): 27 min  OT Assessment / Plan / Recommendation Comments on Treatment Session Pt is overall modified independent for selfcare tasks and toileting at this time.  Still with reports of significant back pain with mobility and transitional movements.  No further acute or post acute OT needs.          Equipment Recommendations  None recommended by OT          Plan All goals met and education completed, patient discharged from OT services    Precautions / Restrictions Precautions Precautions: Back Precaution Comments: Pt verbalized all 3 back precautions.  Does well maintaining with mobility Required Braces or Orthoses: Spinal Brace Spinal Brace: Lumbar corset;Applied in sitting position Restrictions Weight Bearing Restrictions: No   Pertinent Vitals/Pain Vitals stable    ADL  Grooming: Performed;Modified independent Where Assessed - Grooming: Supported standing Upper Body Dressing: Performed;Modified independent;Other (comment) (including back corset) Where Assessed - Upper Body Dressing: Unsupported sitting Toilet Transfer: Performed;Modified independent Toilet Transfer Method: Other (comment) (ambulate with assistive device) Toilet Transfer Equipment: Comfort height toilet;Grab bars Toileting - Clothing Manipulation and Hygiene: Performed;Modified independent Where Assessed - Toileting Clothing Manipulation and Hygiene: Sit to stand from 3-in-1 or toilet Equipment Used: Rolling walker;Back brace Transfers/Ambulation Related to ADLs: Pt is overall modified independent with mobility with use of a RW. ADL Comments: Pt overall modified indepent with selfcare tasks.  She just moves a little slower or needs increased time to perform all tasks.  Discussed using a small plastic chair which she already has for the tub.       OT Goals ADL Goals ADL Goal: Grooming - Progress: Met ADL Goal: Lower Body Bathing - Progress: Met ADL Goal: Lower Body Dressing - Progress: Met ADL Goal: Toilet Transfer - Progress: Met  Visit Information  Last OT Received On: 05/18/12 Assistance Needed: +1    Subjective Data  Subjective: I hope I don't have to go through this again. Patient Stated Goal: No report but agreeable to getting out of bed and performing grooming and toileting tasks.      Cognition  Cognition Overall Cognitive Status: Appears within functional limits for tasks assessed/performed Arousal/Alertness: Awake/alert Orientation Level: Appears intact for tasks assessed Behavior During Session: [facility] for tasks performed    Mobility  Bed Mobility Bed Mobility: Right Sidelying to Sit;Rolling Right Rolling Right: 6: Modified independent (Device/Increase time) Right Sidelying to Sit: 6: Modified independent (Device/Increase time) Details for Bed Mobility Assistance: Demonstrated proper technique.   Transfers Transfers: Sit to Stand Sit to Stand: 6: Modified independent (Device/Increase time);With upper extremity assist;From chair/3-in-1 Stand to Sit: 6: Modified independent (Device/Increase time);To bed       Balance Dynamic Standing Balance Dynamic Standing - Balance Support: Right upper extremity supported;Left upper extremity supported Dynamic Standing - Level of Assistance: 6: Modified independent (Device/Increase time)   End of Session OT - End of Session Equipment Utilized During Treatment: Back brace Activity Tolerance: Patient limited by pain Patient left: in bed;with call bell/phone within reach;with family/visitor present Nurse Communication: Mobility status;Patient requests pain meds    Helayne Metsker OTR/L Pager number [phone] 05/18/2012, 3:24 PM

## 2012-05-18 NOTE — Progress Notes (Signed)
Feeling some better today. Less right groin and thigh pain. Feels ready to get off the PCA and start mobilizing more.  Afebrile. Vital stable. Awake and alert. No nuchal rigidity. Motor exam intact bilaterally. Sensory examination with some mild degree sensation in her right L4 and L5 dermatomes. Wound clean and dry. Chest and abdomen benign.  Beginning to turn the corner with regard pain control. We'll stop PCA and resume and intermittent oxycodone. Dilaudid for breakthrough pain. Encourage mobilization. Work towards discharge Thursday or Friday.

## 2012-05-18 NOTE — Progress Notes (Signed)
Physical Therapy Treatment Patient Details Name: [name] MRN: [id number] DOB: 13-Nov-1965 Today's Date: 05/18/2012 Time: 4098-1191 PT Time Calculation (min): 24 min  PT Assessment / Plan / Recommendation Comments on Treatment Session  Tolerated increased ambulation distance today as well as stair training.  Cont's to move well despite pain-- appropriate to d/c home when medically ready.  Pt has met all PT goals at this time.  PT services will now sign off.      Follow Up Recommendations  Home health PT;Supervision/Assistance - 24 hour     Does the patient have the potential to tolerate intense rehabilitation     Barriers to Discharge        Equipment Recommendations  Rolling walker with 5" wheels    Recommendations for Other Services    Frequency Min 5X/week   Plan Discharge plan remains appropriate;Frequency remains appropriate    Precautions / Restrictions Precautions Precautions: Back;Fall Precaution Comments: Pt verbalized all 3 back precautions.  Does well maintaining with mobility Required Braces or Orthoses: Spinal Brace Spinal Brace: Lumbar corset;Applied in sitting position Restrictions Weight Bearing Restrictions: No       Mobility  Bed Mobility Bed Mobility: Rolling Right;Right Sidelying to Sit;Sitting - Scoot to Edge of Bed Rolling Right: 6: Modified independent (Device/Increase time) Right Sidelying to Sit: 6: Modified independent (Device/Increase time);HOB flat Details for Bed Mobility Assistance: Demonstrated proper technique.   Transfers Transfers: Sit to Stand;Stand to Sit Sit to Stand: 6: Modified independent (Device/Increase time);With upper extremity assist;From chair/3-in-1;From bed;With armrests;From toilet Stand to Sit: 6: Modified independent (Device/Increase time);With upper extremity assist;With armrests;To chair/3-in-1;To toilet Ambulation/Gait Ambulation/Gait Assistance: 5: Supervision Ambulation Distance (Feet): 200 Feet Assistive  device: Rolling walker Ambulation/Gait Assistance Details: Pt tolerating increased distance.   Encouragement to decrease reliance of UE's on RW.    Gait Pattern: Step-through pattern;Decreased stride length Gait velocity: decreased Stairs: Yes Stairs Assistance: 4: Min assist Stairs Assistance Details (indicate cue type and reason): (A) for stability via HHA.  Cues for sequencing & technique.  Pt's fiance present Stair Management Technique: No rails;Step to pattern;Forwards Number of Stairs: 2 (2x's) Wheelchair Mobility Wheelchair Mobility: No      PT Goals Acute Rehab PT Goals Time For Goal Achievement: 05/26/12 Potential to Achieve Goals: Good Pt will Roll Supine to Right Side: with modified independence PT Goal: Rolling Supine to Right Side - Progress: Met Pt will Roll Supine to Left Side: with modified independence Pt will go Supine/Side to Sit: with modified independence;with HOB 0 degrees PT Goal: Supine/Side to Sit - Progress: Met Pt will go Sit to Stand: with supervision PT Goal: Sit to Stand - Progress: Met Pt will go Stand to Sit: with supervision PT Goal: Stand to Sit - Progress: Met Pt will Transfer Bed to Chair/Chair to Bed: with supervision PT Transfer Goal: Bed to Chair/Chair to Bed - Progress: Met Pt will Ambulate: >150 feet;with supervision;with rolling walker PT Goal: Ambulate - Progress: Met Pt will Go Up / Down Stairs: 3-5 stairs;with min assist;with rail(s) PT Goal: Up/Down Stairs - Progress: Met  Visit Information  Last PT Received On: 05/18/12 Assistance Needed: +1    Subjective Data      Cognition  Cognition Overall Cognitive Status: Appears within functional limits for tasks assessed/performed Arousal/Alertness: Awake/alert Orientation Level: Appears intact for tasks assessed Behavior During Session: [facility] for tasks performed    Balance     End of Session PT - End of Session Equipment Utilized During Treatment: Gait belt;Back brace Activity  Tolerance: Patient tolerated treatment well Patient left: in chair;with call bell/phone within reach;with family/visitor present Nurse Communication: Mobility status    Reeta Kuk, [place] [phone] 05/18/2012

## 2012-05-18 NOTE — Progress Notes (Signed)
**  Late Entry for 05/17/12**  PT Progress Note:      05/17/12 1500  PT Visit Information  Last PT Received On 05/17/12  Assistance Needed +1  PT Time Calculation  PT Start Time 0107  PT Stop Time 0130  PT Time Calculation (min) 23 min  Precautions  Precautions Back;Fall  Precaution Comments Able to recall 3/3 back precautions with verbal cues for arching.  Required Braces or Orthoses Spinal Brace  Spinal Brace Lumbar corset;Applied in sitting position  Restrictions  Weight Bearing Restrictions No  Cognition  Overall Cognitive Status Appears within functional limits for tasks assessed/performed  Arousal/Alertness Awake/alert  Orientation Level Appears intact for tasks assessed  Behavior During Session [facility] for tasks performed  Bed Mobility  Bed Mobility Sit to Sidelying Right  Sit to Sidelying Right 4: Min assist;HOB flat  Details for Bed Mobility Assistance (A) to lift LE's onto bed.  Increased time required.  Cues to bend knees with rolling to increase ease of transitional movements.    Transfers  Transfers Sit to Stand;Stand to [employer] Transfers  Sit to Stand 6: Modified independent (Device/Increase time);With upper extremity assist;With armrests;From chair/3-in-1;From bed  Stand to Sit 6: Modified independent (Device/Increase time);With upper extremity assist;With armrests;To chair/3-in-1;To bed  Stand Pivot Transfers 6: Modified independent (Device/Increase time)  Details for Transfer Assistance Pt performs   Ambulation/Gait  Ambulation/Gait Assistance 4: Min guard;5: Supervision  Ambulation Distance (Feet) 130 Feet  Assistive device Rolling walker  Ambulation/Gait Assistance Details Progressing from Min Guard-->(S) for safety due to pain.  Ocassional cueing to stay inside RW.  Pt with slow, small shuffling steps.  Painful.    Gait Pattern Step-through pattern;Decreased stride length;Decreased step length - right;Decreased step length - left;Shuffle  Gait velocity  decreased  PT - End of Session  Equipment Utilized During Treatment Gait belt;Back brace  Activity Tolerance Patient limited by pain  Patient left in bed;with call bell/phone within reach;with family/visitor present  Nurse Communication Mobility status  PT - Assessment/Plan  Comments on Treatment Session Pt moving fairly well despite pain.    PT Plan Discharge plan remains appropriate;Frequency remains appropriate  PT Frequency Min 5X/week  Follow Up Recommendations Home health PT;Supervision/Assistance - 24 hour  PT equipment Rolling walker with 5" wheels  Acute Rehab PT Goals  Time For Goal Achievement 05/26/12  Potential to Achieve Goals Good  Pt will Roll Supine to Right Side with modified independence  Pt will Roll Supine to Left Side with modified independence  Pt will go Supine/Side to Sit with modified independence;with HOB 0 degrees  Pt will go Sit to Stand with supervision  PT Goal: Sit to Stand - Progress Met  Pt will go Stand to Sit with supervision  PT Goal: Stand to Sit - Progress Met  Pt will Transfer Bed to Chair/Chair to Bed with supervision  PT Transfer Goal: Bed to Chair/Chair to Bed - Progress Met  Pt will Ambulate >150 feet;with supervision;with rolling walker  PT Goal: Ambulate - Progress Progressing toward goal  Pt will Go Up / Down Stairs 3-5 stairs;with min assist;with rail(s)  PT General Charges  $$ ACUTE PT VISIT 1 Procedure  PT Treatments  $Gait Training 8-22 mins  $Therapeutic Activity 8-22 mins    [name], [place] [phone] 05/18/2012

## 2012-05-19 MED ORDER — HEPARIN SOD (PORK) LOCK FLUSH 100 UNIT/ML IV SOLN: 250.0000 [IU] | Freq: Every day | INTRAVENOUS | Status: DC

## 2012-05-19 MED ORDER — VANCOMYCIN HCL IN DEXTROSE 1-5 GM/200ML-% IV SOLN: 1000.0000 mg | Freq: Two times a day (BID) | INTRAVENOUS | Status: DC

## 2012-05-19 MED ORDER — RIFAMPIN 300 MG PO CAPS: 300.0000 mg | ORAL_CAPSULE | Freq: Two times a day (BID) | ORAL | Status: DC

## 2012-05-19 MED ORDER — HEPARIN SOD (PORK) LOCK FLUSH 100 UNIT/ML IV SOLN: 250.0000 [IU] | INTRAVENOUS | Status: DC | PRN

## 2012-05-19 MED ORDER — HEPARIN SOD (PORK) LOCK FLUSH 100 UNIT/ML IV SOLN
250.0000 [IU] | Freq: Every day | INTRAVENOUS | Status: DC
Start: 2012-05-19 — End: 2012-05-19
  Filled 2012-05-19: qty 3

## 2012-05-19 MED ORDER — HEPARIN SOD (PORK) LOCK FLUSH 100 UNIT/ML IV SOLN
250.0000 [IU] | INTRAVENOUS | Status: DC | PRN
  Administered 2012-05-19: 250 [IU]
  Filled 2012-05-19: qty 3

## 2012-05-19 MED ORDER — OXYCODONE HCL 15 MG PO TABS: 30.0000 mg | ORAL_TABLET | ORAL | Status: AC | PRN

## 2012-05-19 NOTE — Discharge Summary (Signed)
Physician Discharge Summary  Patient ID: [name] MRN: [id number] DOB/AGE: [birth year] 47 y.o.  Admit date: 05/11/2012 Discharge date: 05/19/2012  Admission Diagnoses:  Discharge Diagnoses:  Principal Problem:   Abscess in epidural space of lumbar spine   Discharged Condition: good  Hospital Course: Patient admitted to the hospital for treatment of a presumed infected lumbar wound. MRI scan consistent with lumbar epidural abscess. Patient taken to the operating room for wound exploration and subsequent I&D. Cultures grew MRSA. Patient placed on vancomycin and oral rifampin for treatment. Symptoms have gradually improved. She is now mobilizing without major difficulty. Her pain is controlled with her Duragesic patch and oral oxycodone. She feels ready for discharge. Plan is for home therapy and nursing. She'll follow up with me in 2 weeks.  Consults:   Significant Diagnostic Studies:   Treatments:   Discharge Exam: Blood pressure 105/52, pulse 66, temperature 99.5 F (37.5 C), temperature source Oral, resp. rate 17, height 5\' 5"  (1.651 m), weight 79.334 kg (174 lb 14.4 oz), SpO2 95.00%.   Disposition: 01-Home or Self Care     Medication List    TAKE these medications       albuterol 108 (90 BASE) MCG/ACT inhaler  Commonly known as:  PROVENTIL HFA;VENTOLIN HFA  Inhale 2 puffs into the lungs every 6 (six) hours as needed. As needed for shortness of breath.     bisacodyl 5 MG EC tablet  Commonly known as:  DULCOLAX  Take 5 mg by mouth daily as needed. For constipation     diazepam 10 MG tablet  Commonly known as:  VALIUM  Take 1 tablet (10 mg total) by mouth 3 (three) times daily.     docusate sodium 100 MG capsule  Commonly known as:  COLACE  Take 200 mg by mouth daily. For constipation     esomeprazole 40 MG capsule  Commonly known as:  NEXIUM  Take 40 mg by mouth 2 (two) times daily.     fentaNYL 100 MCG/HR  Commonly known as:  DURAGESIC - dosed mcg/hr   Place 1 patch (100 mcg total) onto the skin every other day.     GOODY HEADACHE PO  Take 1-2 packets by mouth daily as needed. As needed for headaches.     hydrochlorothiazide 25 MG tablet  Commonly known as:  HYDRODIURIL  Take 25 mg by mouth daily.     levothyroxine 88 MCG tablet  Commonly known as:  SYNTHROID, LEVOTHROID  Take 88 mcg by mouth daily.     methylcellulose 1 % ophthalmic solution  Commonly known as:  ARTIFICIAL TEARS  Place 2 drops into both eyes as needed. As needed for eye irritation.     metoprolol tartrate 25 MG tablet  Commonly known as:  LOPRESSOR  Take 25 mg by mouth 2 (two) times daily.     oxyCODONE 15 MG immediate release tablet  Commonly known as:  ROXICODONE  Take 2 tablets (30 mg total) by mouth every 4 (four) hours as needed. As needed for pain.     pravastatin 20 MG tablet  Commonly known as:  PRAVACHOL  Take 20 mg by mouth daily.     promethazine 25 MG tablet  Commonly known as:  PHENERGAN  Take 25 mg by mouth every 8 (eight) hours as needed. For nausea and vomiting     rifampin 300 MG capsule  Commonly known as:  RIFADIN  Take 1 capsule (300 mg total) by mouth every 12 (twelve) hours.  vancomycin 1 GM/200ML Soln  Commonly known as:  VANCOCIN  Inject 200 mLs (1,000 mg total) into the vein every 12 (twelve) hours.           Follow-up Information   Follow up with Keera Altidor A, MD. Call in 2 weeks. (Ask for Lurena Joiner)    Contact information:   [address] [place] [address] [phone]       Signed: Julio Sicks A 05/19/2012, 8:31 AM

## 2012-05-23 NOTE — Progress Notes (Signed)
Physical Therapy Treatment Patient Details Name: [name] MRN: [id number] DOB: 1965-04-02 Today's Date: 05/18/2012 Time: 7829-5621 PT Time Calculation (min): 24 min  PT Assessment / Plan / Recommendation Comments on Treatment Session  Tolerated increased ambulation distance today as well as stair training.  Cont's to move well despite pain-- appropriate to d/c home when medically ready.  Pt has met all PT goals at this time.  PT services will now sign off.      Follow Up Recommendations  Home health PT;Supervision/Assistance - 24 hour     Does the patient have the potential to tolerate intense rehabilitation     Barriers to Discharge        Equipment Recommendations  Rolling walker with 5" wheels    Recommendations for Other Services    Frequency Min 5X/week   Plan Discharge plan remains appropriate;Frequency remains appropriate    Precautions / Restrictions Precautions Precautions: Back;Fall Precaution Comments: Pt verbalized all 3 back precautions.  Does well maintaining with mobility Required Braces or Orthoses: Spinal Brace Spinal Brace: Lumbar corset;Applied in sitting position Restrictions Weight Bearing Restrictions: No       Mobility  Bed Mobility Bed Mobility: Rolling Right;Right Sidelying to Sit;Sitting - Scoot to Edge of Bed Rolling Right: 6: Modified independent (Device/Increase time) Right Sidelying to Sit: 6: Modified independent (Device/Increase time);HOB flat Details for Bed Mobility Assistance: Demonstrated proper technique.   Transfers Transfers: Sit to Stand;Stand to Sit Sit to Stand: 6: Modified independent (Device/Increase time);With upper extremity assist;From chair/3-in-1;From bed;With armrests;From toilet Stand to Sit: 6: Modified independent (Device/Increase time);With upper extremity assist;With armrests;To chair/3-in-1;To toilet Ambulation/Gait Ambulation/Gait Assistance: 5: Supervision Ambulation Distance (Feet): 200 Feet Assistive  device: Rolling walker Ambulation/Gait Assistance Details: Pt tolerating increased distance.   Encouragement to decrease reliance of UE's on RW.    Gait Pattern: Step-through pattern;Decreased stride length Gait velocity: decreased Stairs: Yes Stairs Assistance: 4: Min assist Stairs Assistance Details (indicate cue type and reason): (A) for stability via HHA.  Cues for sequencing & technique.  Pt's fiance present Stair Management Technique: No rails;Step to pattern;Forwards Number of Stairs: 2 (2x's) Wheelchair Mobility Wheelchair Mobility: No      PT Goals Acute Rehab PT Goals Time For Goal Achievement: 05/26/12 Potential to Achieve Goals: Good Pt will Roll Supine to Right Side: with modified independence PT Goal: Rolling Supine to Right Side - Progress: Met Pt will Roll Supine to Left Side: with modified independence Pt will go Supine/Side to Sit: with modified independence;with HOB 0 degrees PT Goal: Supine/Side to Sit - Progress: Met Pt will go Sit to Stand: with supervision PT Goal: Sit to Stand - Progress: Met Pt will go Stand to Sit: with supervision PT Goal: Stand to Sit - Progress: Met Pt will Transfer Bed to Chair/Chair to Bed: with supervision PT Transfer Goal: Bed to Chair/Chair to Bed - Progress: Met Pt will Ambulate: >150 feet;with supervision;with rolling walker PT Goal: Ambulate - Progress: Met Pt will Go Up / Down Stairs: 3-5 stairs;with min assist;with rail(s) PT Goal: Up/Down Stairs - Progress: Met  Visit Information  Last PT Received On: 05/18/12 Assistance Needed: +1    Subjective Data      Cognition  Cognition Overall Cognitive Status: Appears within functional limits for tasks assessed/performed Arousal/Alertness: Awake/alert Orientation Level: Appears intact for tasks assessed Behavior During Session: [facility] for tasks performed    Balance     End of Session PT - End of Session Equipment Utilized During Treatment: Gait belt;Back brace Activity  Tolerance: Patient tolerated treatment well Patient left: in chair;with call bell/phone within reach;with family/visitor present Nurse Communication: Mobility status    [name], [place] [phone] 05/18/2012  Agree with above assessment  Lewis Shock, PT, DPT Pager #: [phone] Office #: [phone]

## 2012-07-25 ENCOUNTER — Other Ambulatory Visit: Payer: Self-pay | Admitting: Neurosurgery

## 2012-07-25 DIAGNOSIS — M48061 Spinal stenosis, lumbar region without neurogenic claudication: Secondary | ICD-10-CM

## 2012-08-03 ENCOUNTER — Ambulatory Visit
Admission: RE | Admit: 2012-08-03 | Discharge: 2012-08-03 | Disposition: A | Discharge status: Home / Self Care | Payer: Medicare Other | Source: Ambulatory Visit | Attending: Neurosurgery | Admitting: Neurosurgery

## 2012-08-03 ENCOUNTER — Other Ambulatory Visit: Payer: Medicare Other

## 2012-08-03 DIAGNOSIS — M48061 Spinal stenosis, lumbar region without neurogenic claudication: Secondary | ICD-10-CM

## 2012-08-03 MED ORDER — GADOBENATE DIMEGLUMINE 529 MG/ML IV SOLN
15.0000 mL | Freq: Once | INTRAVENOUS | Status: AC | PRN
  Administered 2012-08-03: 15 mL via INTRAVENOUS

## 2012-08-10 ENCOUNTER — Other Ambulatory Visit: Payer: Medicare Other

## 2014-04-06 ENCOUNTER — Encounter (HOSPITAL_COMMUNITY): Payer: Self-pay | Admitting: *Deleted

## 2014-04-06 ENCOUNTER — Encounter (HOSPITAL_COMMUNITY)
Admission: RE | Disposition: A | Discharge status: Home / Self Care | Payer: Self-pay | Source: Ambulatory Visit | Attending: Orthopedic Surgery

## 2014-04-06 ENCOUNTER — Ambulatory Visit (HOSPITAL_COMMUNITY)
Admission: RE | Admit: 2014-04-06 | Discharge: 2014-04-07 | Disposition: A | Discharge status: Home / Self Care | Payer: Medicare HMO | Source: Ambulatory Visit | Attending: Orthopedic Surgery | Admitting: Orthopedic Surgery

## 2014-04-06 ENCOUNTER — Inpatient Hospital Stay (HOSPITAL_COMMUNITY): Payer: Medicare HMO | Admitting: Anesthesiology

## 2014-04-06 ENCOUNTER — Inpatient Hospital Stay (HOSPITAL_COMMUNITY): Payer: Medicare HMO

## 2014-04-06 DIAGNOSIS — G8929 Other chronic pain: Secondary | ICD-10-CM | POA: Insufficient documentation

## 2014-04-06 DIAGNOSIS — Z01818 Encounter for other preprocedural examination: Secondary | ICD-10-CM

## 2014-04-06 DIAGNOSIS — R350 Frequency of micturition: Secondary | ICD-10-CM | POA: Insufficient documentation

## 2014-04-06 DIAGNOSIS — K5190 Ulcerative colitis, unspecified, without complications: Secondary | ICD-10-CM | POA: Diagnosis not present

## 2014-04-06 DIAGNOSIS — K219 Gastro-esophageal reflux disease without esophagitis: Secondary | ICD-10-CM | POA: Insufficient documentation

## 2014-04-06 DIAGNOSIS — M1990 Unspecified osteoarthritis, unspecified site: Secondary | ICD-10-CM | POA: Diagnosis not present

## 2014-04-06 DIAGNOSIS — S61459A Open bite of unspecified hand, initial encounter: Secondary | ICD-10-CM | POA: Diagnosis present

## 2014-04-06 DIAGNOSIS — E785 Hyperlipidemia, unspecified: Secondary | ICD-10-CM | POA: Insufficient documentation

## 2014-04-06 DIAGNOSIS — F17210 Nicotine dependence, cigarettes, uncomplicated: Secondary | ICD-10-CM | POA: Insufficient documentation

## 2014-04-06 DIAGNOSIS — J439 Emphysema, unspecified: Secondary | ICD-10-CM | POA: Diagnosis not present

## 2014-04-06 DIAGNOSIS — Z86010 Personal history of colonic polyps: Secondary | ICD-10-CM | POA: Diagnosis not present

## 2014-04-06 DIAGNOSIS — I96 Gangrene, not elsewhere classified: Secondary | ICD-10-CM | POA: Insufficient documentation

## 2014-04-06 DIAGNOSIS — K5909 Other constipation: Secondary | ICD-10-CM | POA: Diagnosis not present

## 2014-04-06 DIAGNOSIS — F329 Major depressive disorder, single episode, unspecified: Secondary | ICD-10-CM | POA: Diagnosis not present

## 2014-04-06 DIAGNOSIS — M6588 Other synovitis and tenosynovitis, other site: Secondary | ICD-10-CM | POA: Diagnosis present

## 2014-04-06 DIAGNOSIS — M549 Dorsalgia, unspecified: Secondary | ICD-10-CM | POA: Diagnosis not present

## 2014-04-06 DIAGNOSIS — H04129 Dry eye syndrome of unspecified lacrimal gland: Secondary | ICD-10-CM | POA: Insufficient documentation

## 2014-04-06 DIAGNOSIS — J45909 Unspecified asthma, uncomplicated: Secondary | ICD-10-CM | POA: Insufficient documentation

## 2014-04-06 DIAGNOSIS — I10 Essential (primary) hypertension: Secondary | ICD-10-CM | POA: Insufficient documentation

## 2014-04-06 DIAGNOSIS — W5501XA Bitten by cat, initial encounter: Secondary | ICD-10-CM

## 2014-04-06 DIAGNOSIS — R3915 Urgency of urination: Secondary | ICD-10-CM | POA: Insufficient documentation

## 2014-04-06 DIAGNOSIS — Z881 Allergy status to other antibiotic agents status: Secondary | ICD-10-CM | POA: Insufficient documentation

## 2014-04-06 DIAGNOSIS — E039 Hypothyroidism, unspecified: Secondary | ICD-10-CM | POA: Diagnosis not present

## 2014-04-06 DIAGNOSIS — F419 Anxiety disorder, unspecified: Secondary | ICD-10-CM | POA: Diagnosis not present

## 2014-04-06 HISTORY — PX: TENDON REPAIR: SHX5111

## 2014-04-06 LAB — CBC
HEMATOCRIT: 38.9 % (ref 36.0–46.0)
Hemoglobin: 12.7 g/dL (ref 12.0–15.0)
MCH: 28 pg (ref 26.0–34.0)
MCHC: 32.6 g/dL (ref 30.0–36.0)
MCV: 85.9 fL (ref 78.0–100.0)
Platelets: 124 10*3/uL — ABNORMAL LOW (ref 150–400)
RBC: 4.53 MIL/uL (ref 3.87–5.11)
RDW: 17.1 % — ABNORMAL HIGH (ref 11.5–15.5)
WBC: 4.9 10*3/uL (ref 4.0–10.5)

## 2014-04-06 LAB — BASIC METABOLIC PANEL
ANION GAP: 7 (ref 5–15)
BUN: 15 mg/dL (ref 6–23)
CALCIUM: 9 mg/dL (ref 8.4–10.5)
CHLORIDE: 105 meq/L (ref 96–112)
CO2: 26 mmol/L (ref 19–32)
Creatinine, Ser: 0.88 mg/dL (ref 0.50–1.10)
GFR calc Af Amer: 88 mL/min — ABNORMAL LOW (ref >90)
GFR, EST NON AFRICAN AMERICAN: 76 mL/min — AB (ref >90)
Glucose, Bld: 73 mg/dL (ref 70–99)
POTASSIUM: 4.1 mmol/L (ref 3.5–5.1)
Sodium: 138 mmol/L (ref 135–145)

## 2014-04-06 SURGERY — TENDON REPAIR
Anesthesia: General | Site: Finger | Laterality: Right

## 2014-04-06 MED ORDER — DIPHENHYDRAMINE HCL 50 MG/ML IJ SOLN
INTRAMUSCULAR | Status: DC | PRN
  Administered 2014-04-06: 6 mg via INTRAVENOUS

## 2014-04-06 MED ORDER — HYDROCODONE-ACETAMINOPHEN 10-325 MG PO TABS: 1.0000 | ORAL_TABLET | ORAL | Status: DC | PRN

## 2014-04-06 MED ORDER — HYDROMORPHONE HCL 1 MG/ML IJ SOLN
INTRAMUSCULAR | Status: AC
  Filled 2014-04-06: qty 1

## 2014-04-06 MED ORDER — HYDROMORPHONE HCL 1 MG/ML IJ SOLN
0.2500 mg | INTRAMUSCULAR | Status: DC | PRN
  Administered 2014-04-06 (×4): 0.5 mg via INTRAVENOUS

## 2014-04-06 MED ORDER — MIDAZOLAM HCL 5 MG/5ML IJ SOLN
INTRAMUSCULAR | Status: DC | PRN
  Administered 2014-04-06: 2 mg via INTRAVENOUS

## 2014-04-06 MED ORDER — KCL IN DEXTROSE-NACL 20-5-0.45 MEQ/L-%-% IV SOLN
INTRAVENOUS | Status: DC
  Administered 2014-04-06: 23:00:00 via INTRAVENOUS
  Filled 2014-04-06 (×2): qty 1000

## 2014-04-06 MED ORDER — PROPOFOL 10 MG/ML IV BOLUS
INTRAVENOUS | Status: DC | PRN
  Administered 2014-04-06: 150 mg via INTRAVENOUS

## 2014-04-06 MED ORDER — LORATADINE 10 MG PO TABS
10.0000 mg | ORAL_TABLET | Freq: Every day | ORAL | Status: DC
  Administered 2014-04-07 (×2): 10 mg via ORAL
  Filled 2014-04-06 (×2): qty 1

## 2014-04-06 MED ORDER — DIAZEPAM 5 MG PO TABS
10.0000 mg | ORAL_TABLET | Freq: Three times a day (TID) | ORAL | Status: DC
  Administered 2014-04-07 (×2): 10 mg via ORAL
  Filled 2014-04-06 (×2): qty 2

## 2014-04-06 MED ORDER — ALBUTEROL SULFATE HFA 108 (90 BASE) MCG/ACT IN AERS: 2.0000 | INHALATION_SPRAY | Freq: Four times a day (QID) | RESPIRATORY_TRACT | Status: DC | PRN

## 2014-04-06 MED ORDER — PANTOPRAZOLE SODIUM 40 MG PO TBEC
40.0000 mg | DELAYED_RELEASE_TABLET | Freq: Every day | ORAL | Status: DC
  Administered 2014-04-07 (×2): 40 mg via ORAL
  Filled 2014-04-06 (×2): qty 1

## 2014-04-06 MED ORDER — ALBUTEROL SULFATE (2.5 MG/3ML) 0.083% IN NEBU
2.5000 mg | INHALATION_SOLUTION | Freq: Four times a day (QID) | RESPIRATORY_TRACT | Status: DC | PRN
Start: 2014-04-06 — End: 2014-04-07

## 2014-04-06 MED ORDER — FENTANYL CITRATE 0.05 MG/ML IJ SOLN
INTRAMUSCULAR | Status: AC
  Filled 2014-04-06: qty 5

## 2014-04-06 MED ORDER — LACTATED RINGERS IV SOLN
INTRAVENOUS | Status: DC
  Administered 2014-04-06: 19:00:00 via INTRAVENOUS

## 2014-04-06 MED ORDER — LIDOCAINE HCL (CARDIAC) 20 MG/ML IV SOLN
INTRAVENOUS | Status: DC | PRN
  Administered 2014-04-06: 60 mg via INTRAVENOUS

## 2014-04-06 MED ORDER — LACTATED RINGERS IV SOLN
INTRAVENOUS | Status: DC | PRN
  Administered 2014-04-06: 22:00:00 via INTRAVENOUS

## 2014-04-06 MED ORDER — BUPIVACAINE HCL (PF) 0.25 % IJ SOLN
INTRAMUSCULAR | Status: AC
  Filled 2014-04-06: qty 30

## 2014-04-06 MED ORDER — BISACODYL 5 MG PO TBEC: 5.0000 mg | DELAYED_RELEASE_TABLET | Freq: Every day | ORAL | Status: DC | PRN

## 2014-04-06 MED ORDER — ESTRADIOL 1 MG PO TABS
1.0000 mg | ORAL_TABLET | Freq: Every day | ORAL | Status: DC
  Administered 2014-04-07: 1 mg via ORAL
  Filled 2014-04-06: qty 1

## 2014-04-06 MED ORDER — FENTANYL 100 MCG/HR TD PT72: 100.0000 ug | MEDICATED_PATCH | TRANSDERMAL | Status: DC

## 2014-04-06 MED ORDER — AMPICILLIN-SULBACTAM SODIUM 3 (2-1) G IJ SOLR
3.0000 g | INTRAMUSCULAR | Status: AC
  Administered 2014-04-06: 3 g via INTRAVENOUS
  Filled 2014-04-06: qty 3

## 2014-04-06 MED ORDER — MIDAZOLAM HCL 2 MG/2ML IJ SOLN
INTRAMUSCULAR | Status: AC
  Filled 2014-04-06: qty 2

## 2014-04-06 MED ORDER — ONDANSETRON 8 MG PO TBDP
8.0000 mg | ORAL_TABLET | Freq: Three times a day (TID) | ORAL | Status: DC | PRN
  Filled 2014-04-06: qty 1

## 2014-04-06 MED ORDER — ONDANSETRON HCL 4 MG/2ML IJ SOLN
INTRAMUSCULAR | Status: DC | PRN
  Administered 2014-04-06: 4 mg via INTRAVENOUS

## 2014-04-06 MED ORDER — METHOCARBAMOL 1000 MG/10ML IJ SOLN
500.0000 mg | Freq: Four times a day (QID) | INTRAVENOUS | Status: DC | PRN
  Administered 2014-04-06: 500 mg via INTRAVENOUS
  Filled 2014-04-06 (×2): qty 5

## 2014-04-06 MED ORDER — PROPOFOL 10 MG/ML IV BOLUS
INTRAVENOUS | Status: AC
  Filled 2014-04-06: qty 20

## 2014-04-06 MED ORDER — OXYCODONE HCL 5 MG PO TABS
30.0000 mg | ORAL_TABLET | ORAL | Status: DC | PRN
  Administered 2014-04-07 (×3): 30 mg via ORAL
  Filled 2014-04-06 (×3): qty 6

## 2014-04-06 MED ORDER — HYDROMORPHONE HCL 1 MG/ML IJ SOLN
0.5000 mg | INTRAMUSCULAR | Status: DC | PRN
  Administered 2014-04-07: 1 mg via INTRAVENOUS
  Filled 2014-04-06 (×2): qty 1

## 2014-04-06 MED ORDER — BUDESONIDE-FORMOTEROL FUMARATE 80-4.5 MCG/ACT IN AERO
2.0000 | INHALATION_SPRAY | Freq: Two times a day (BID) | RESPIRATORY_TRACT | Status: DC
Start: 2014-04-06 — End: 2014-04-07
  Administered 2014-04-07: 2 via RESPIRATORY_TRACT
  Filled 2014-04-06: qty 6.9

## 2014-04-06 MED ORDER — OXYCODONE HCL 5 MG PO TABS
5.0000 mg | ORAL_TABLET | ORAL | Status: DC | PRN
  Administered 2014-04-07: 10 mg via ORAL
  Filled 2014-04-06: qty 2

## 2014-04-06 MED ORDER — TRAZODONE HCL 50 MG PO TABS
50.0000 mg | ORAL_TABLET | Freq: Every day | ORAL | Status: DC
  Administered 2014-04-07: 50 mg via ORAL
  Filled 2014-04-06 (×2): qty 1

## 2014-04-06 MED ORDER — FENTANYL CITRATE 0.05 MG/ML IJ SOLN
INTRAMUSCULAR | Status: DC | PRN
  Administered 2014-04-06: 50 ug via INTRAVENOUS
  Administered 2014-04-06 (×2): 100 ug via INTRAVENOUS

## 2014-04-06 MED ORDER — LEVOTHYROXINE SODIUM 50 MCG PO TABS
50.0000 ug | ORAL_TABLET | Freq: Every day | ORAL | Status: DC
  Administered 2014-04-07: 50 ug via ORAL
  Filled 2014-04-06 (×2): qty 1

## 2014-04-06 MED ORDER — PRAVASTATIN SODIUM 20 MG PO TABS
20.0000 mg | ORAL_TABLET | Freq: Every day | ORAL | Status: DC
  Administered 2014-04-07: 20 mg via ORAL
  Filled 2014-04-06 (×2): qty 1

## 2014-04-06 MED ORDER — KCL IN DEXTROSE-NACL 20-5-0.45 MEQ/L-%-% IV SOLN
INTRAVENOUS | Status: AC
  Filled 2014-04-06: qty 1000

## 2014-04-06 MED ORDER — DEXAMETHASONE SODIUM PHOSPHATE 4 MG/ML IJ SOLN
INTRAMUSCULAR | Status: DC | PRN
  Administered 2014-04-06: 4 mg via INTRAVENOUS

## 2014-04-06 MED ORDER — METHOCARBAMOL 500 MG PO TABS
500.0000 mg | ORAL_TABLET | Freq: Four times a day (QID) | ORAL | Status: DC | PRN
  Filled 2014-04-06: qty 1

## 2014-04-06 MED ORDER — 0.9 % SODIUM CHLORIDE (POUR BTL) OPTIME
TOPICAL | Status: DC | PRN
  Administered 2014-04-06: 1000 mL

## 2014-04-06 SURGICAL SUPPLY — 45 items
BANDAGE ELASTIC 3 VELCRO ST LF (GAUZE/BANDAGES/DRESSINGS) ×3 IMPLANT
BANDAGE ELASTIC 4 VELCRO ST LF (GAUZE/BANDAGES/DRESSINGS) IMPLANT
BNDG COHESIVE 1X5 TAN STRL LF (GAUZE/BANDAGES/DRESSINGS) IMPLANT
BNDG ESMARK 4X9 LF (GAUZE/BANDAGES/DRESSINGS) ×3 IMPLANT
BNDG GAUZE ELAST 4 BULKY (GAUZE/BANDAGES/DRESSINGS) ×3 IMPLANT
CORDS BIPOLAR (ELECTRODE) ×3 IMPLANT
COVER SURGICAL LIGHT HANDLE (MISCELLANEOUS) ×3 IMPLANT
CUFF TOURNIQUET SINGLE 18IN (TOURNIQUET CUFF) ×3 IMPLANT
CUFF TOURNIQUET SINGLE 24IN (TOURNIQUET CUFF) IMPLANT
DRAPE SURG 17X23 STRL (DRAPES) ×3 IMPLANT
DRSG ADAPTIC 3X8 NADH LF (GAUZE/BANDAGES/DRESSINGS) ×3 IMPLANT
GAUZE SPONGE 2X2 8PLY STRL LF (GAUZE/BANDAGES/DRESSINGS) IMPLANT
GAUZE SPONGE 4X4 12PLY STRL (GAUZE/BANDAGES/DRESSINGS) ×3 IMPLANT
GLOVE BIOGEL PI IND STRL 8.5 (GLOVE) ×1 IMPLANT
GLOVE BIOGEL PI INDICATOR 8.5 (GLOVE) ×2
GLOVE SURG ORTHO 8.0 STRL STRW (GLOVE) ×3 IMPLANT
GOWN STRL REUS W/ TWL LRG LVL3 (GOWN DISPOSABLE) ×2 IMPLANT
GOWN STRL REUS W/ TWL XL LVL3 (GOWN DISPOSABLE) ×1 IMPLANT
GOWN STRL REUS W/TWL LRG LVL3 (GOWN DISPOSABLE) ×4
GOWN STRL REUS W/TWL XL LVL3 (GOWN DISPOSABLE) ×2
KIT BASIN OR (CUSTOM PROCEDURE TRAY) ×3 IMPLANT
KIT ROOM TURNOVER OR (KITS) ×3 IMPLANT
MANIFOLD NEPTUNE II (INSTRUMENTS) IMPLANT
NEEDLE HYPO 25GX1X1/2 BEV (NEEDLE) ×3 IMPLANT
NS IRRIG 1000ML POUR BTL (IV SOLUTION) ×6 IMPLANT
PACK ORTHO EXTREMITY (CUSTOM PROCEDURE TRAY) ×3 IMPLANT
PAD ARMBOARD 7.5X6 YLW CONV (MISCELLANEOUS) ×6 IMPLANT
PAD CAST 4YDX4 CTTN HI CHSV (CAST SUPPLIES) IMPLANT
PADDING CAST COTTON 4X4 STRL (CAST SUPPLIES)
SOAP 2 % CHG 4 OZ (WOUND CARE) ×3 IMPLANT
SPECIMEN JAR SMALL (MISCELLANEOUS) IMPLANT
SPLINT FIBERGLASS 3X12 (CAST SUPPLIES) ×3 IMPLANT
SPONGE GAUZE 2X2 STER 10/PKG (GAUZE/BANDAGES/DRESSINGS)
SUCTION FRAZIER TIP 10 FR DISP (SUCTIONS) ×3 IMPLANT
SUT MERSILENE 4 0 P 3 (SUTURE) IMPLANT
SUT PROLENE 4 0 PS 2 18 (SUTURE) ×3 IMPLANT
SUT VIC AB 2-0 CT1 27 (SUTURE)
SUT VIC AB 2-0 CT1 TAPERPNT 27 (SUTURE) IMPLANT
SYR CONTROL 10ML LL (SYRINGE) ×3 IMPLANT
TOWEL OR 17X24 6PK STRL BLUE (TOWEL DISPOSABLE) ×3 IMPLANT
TOWEL OR 17X26 10 PK STRL BLUE (TOWEL DISPOSABLE) ×3 IMPLANT
TUBE CONNECTING 12'X1/4 (SUCTIONS) ×1
TUBE CONNECTING 12X1/4 (SUCTIONS) ×2 IMPLANT
UNDERPAD 30X30 INCONTINENT (UNDERPADS AND DIAPERS) ×3 IMPLANT
WATER STERILE IRR 1000ML POUR (IV SOLUTION) ×3 IMPLANT

## 2014-04-06 NOTE — H&P (Signed)
[name] is an 49 y.o. female.   Chief Complaint: RIGHT LONG FINGER PAIN HPI: PT WITH CAT BITE AND WORSENING PAIN AND SWELLING PT PRESENTED TO OFFICE WITH PAIN SWELLING AND REDNESS PT HERE FOR SURGERY  NO PRIOR SURGERY TO RIGHT HAND  Past Medical History  Diagnosis Date  . Hypertension     takes Metoprolol and HCTZ daily  . Hyperlipidemia     takes Pravastatin daily  . Asthma   . COPD (chronic obstructive pulmonary disease)   . Emphysema   . Shortness of breath     lying/sitting/exertion;uses inhaler prn  . Pneumonia     last time a yr ago  . History of bronchitis     last time a yr ago  . Headache(784.0)   . Arthritis   . Joint pain   . Joint swelling   . Chronic back pain     ruptured disc and stenosis  . GERD (gastroesophageal reflux disease)     takes Dexilant daily  . Chronic constipation     takes Linzess every other day  . History of colon polyps   . Ulcerative colitis   . Urinary frequency     takes HCTZ daily  . Urinary urgency   . Hypothyroidism     takes SYnthroid daily  . Dry eyes     eye drops prn  . Anxiety     takes Valium daily  . Bilateral cataracts   . Depression   . PONV (postoperative nausea and vomiting)     only one time 4-5 surgeries ago  . Umbilical hernia     Past Surgical History  Procedure Laterality Date  . Back surgery      x 3  . Neck surgery    . Carpal tunnel release      bilateral  . Abdominal hysterectomy    . Tubal ligation    . Cesarean section    . Cholecystectomy    . Hernia repair      x 2  . Colonoscopy    . Esophagogastroduodenoscopy    . Lumbar wound debridement Bilateral 05/11/2012    Procedure: LUMBAR WOUND DEBRIDEMENT;  Surgeon: Charlie Pitter, MD;  Location: [facility];  Service: Neurosurgery;  Laterality: Bilateral;    History reviewed. No pertinent family history. Social History:  reports that she has been smoking Cigarettes.  She has a 49.5 pack-year smoking history. She has never used  smokeless tobacco. She reports that she drinks alcohol. She reports that she does not use illicit drugs.  Allergies:  Allergies  Allergen Reactions  . Ciprofloxacin Nausea And Vomiting    Medications Prior to Admission  Medication Sig Dispense Refill  . albuterol (PROVENTIL HFA;VENTOLIN HFA) 108 (90 BASE) MCG/ACT inhaler Inhale 2 puffs into the lungs every 6 (six) hours as needed for wheezing or shortness of breath.     Marland Kitchen albuterol (PROVENTIL) (2.5 MG/3ML) 0.083% nebulizer solution Take 2.5 mg by nebulization every 6 (six) hours as needed for wheezing or shortness of breath.   3  . Aspirin-Acetaminophen-Caffeine (GOODY HEADACHE PO) Take 1 packet by mouth every 4 (four) hours as needed (headache).     . bisacodyl (DULCOLAX) 5 MG EC tablet Take 5 mg by mouth daily as needed (constipation).     . budesonide-formoterol (SYMBICORT) 80-4.5 MCG/ACT inhaler Inhale 2 puffs into the lungs 2 (two) times daily.    . cetirizine (ZYRTEC) 10 MG tablet Take 10 mg by mouth daily as needed for  allergies.    . diazepam (VALIUM) 10 MG tablet Take 1 tablet (10 mg total) by mouth 3 (three) times daily. 60 tablet 1  . estradiol (ESTRACE) 1 MG tablet Take 1 mg by mouth daily.     . fentaNYL (DURAGESIC - DOSED MCG/HR) 100 MCG/HR Place 1 patch (100 mcg total) onto the skin every other day. 15 patch 0  . levothyroxine (SYNTHROID, LEVOTHROID) 50 MCG tablet Take 50 mcg by mouth at bedtime.    . methylcellulose (ARTIFICIAL TEARS) 1 % ophthalmic solution Place 2 drops into both eyes as needed (eye irritation).     Marland Kitchen omeprazole (PRILOSEC) 40 MG capsule Take 40 mg by mouth 2 (two) times daily.     . ondansetron (ZOFRAN-ODT) 8 MG disintegrating tablet Take 8 mg by mouth every 8 (eight) hours as needed for nausea or vomiting.   3  . oxyCODONE (ROXICODONE) 15 MG immediate release tablet Take 2 tablets (30 mg total) by mouth every 4 (four) hours as needed. As needed for pain. (Patient taking differently: Take 30 mg by mouth every  4 (four) hours. scheduled) 90 tablet 0  . pravastatin (PRAVACHOL) 20 MG tablet Take 20 mg by mouth at bedtime.     . traZODone (DESYREL) 50 MG tablet Take 50 mg by mouth at bedtime.     . heparin lock flush 100 UNIT/ML SOLN 2.5 mLs (250 Units total) by Intracatheter route daily. (Patient not taking: Reported on 04/06/2014) 1000 mL 6  . heparin lock flush 100 UNIT/ML SOLN 2.5 mLs (250 Units total) by Intracatheter route as needed (line care). (Patient not taking: Reported on 04/06/2014) 1000 mL 6  . rifampin (RIFADIN) 300 MG capsule Take 1 capsule (300 mg total) by mouth every 12 (twelve) hours. (Patient not taking: Reported on 04/06/2014) 80 capsule 0  . vancomycin (VANCOCIN) 1 GM/200ML SOLN Inject 200 mLs (1,000 mg total) into the vein every 12 (twelve) hours. (Patient not taking: Reported on 04/06/2014) 4000 mL 6    Results for orders placed or performed during the hospital encounter of 04/06/14 (from the past 48 hour(s))  Basic metabolic panel     Status: Abnormal   Collection Time: 04/06/14  7:15 PM  Result Value Ref Range   Sodium 138 135 - 145 mmol/L    Comment: Please note change in reference range.   Potassium 4.1 3.5 - 5.1 mmol/L    Comment: Please note change in reference range.   Chloride 105 96 - 112 mEq/L   CO2 26 19 - 32 mmol/L   Glucose, Bld 73 70 - 99 mg/dL   BUN 15 6 - 23 mg/dL   Creatinine, Ser 0.88 0.50 - 1.10 mg/dL   Calcium 9.0 8.4 - 10.5 mg/dL   GFR calc non Af Amer 76 (L) >90 mL/min   GFR calc Af Amer 88 (L) >90 mL/min    Comment: (NOTE) The eGFR has been calculated using the CKD EPI equation. This calculation has not been validated in all clinical situations. eGFR's persistently <90 mL/min signify possible Chronic Kidney Disease.    Anion gap 7 5 - 15  CBC     Status: Abnormal   Collection Time: 04/06/14  7:15 PM  Result Value Ref Range   WBC 4.9 4.0 - 10.5 K/uL   RBC 4.53 3.87 - 5.11 MIL/uL   Hemoglobin 12.7 12.0 - 15.0 g/dL   HCT 38.9 36.0 - 46.0 %   MCV  85.9 78.0 - 100.0 fL   MCH 28.0 26.0 -  34.0 pg   MCHC 32.6 30.0 - 36.0 g/dL   RDW 17.1 (H) 11.5 - 15.5 %   Platelets 124 (L) 150 - 400 K/uL   Dg Chest 2 View  04/06/2014   CLINICAL DATA:  Preop finger surgery, smoking history.  EXAM: CHEST  2 VIEW  COMPARISON:  04/1912  FINDINGS: The cardiomediastinal contours are normal. The lungs are clear. Pulmonary vasculature is normal. No consolidation, pleural effusion, or pneumothorax. No acute osseous abnormalities are seen. Fusion hardware in the lower cervical spine, partially included. Mild degenerative disc disease in the mid thoracic spine per  IMPRESSION: No acute pulmonary process.   Electronically Signed   By: Jeb Levering M.D.   On: 04/06/2014 19:19    ROS NO RECENT ILLNESSES OR HOSPITALIZATIONS  There were no vitals taken for this visit. Physical Exam  General Appearance:  Alert, cooperative, no distress, appears stated age  Head:  Normocephalic, without obvious abnormality, atraumatic  Eyes:  Pupils equal, conjunctiva/corneas clear,         Throat: Lips, mucosa, and tongue normal; teeth and gums normal  Neck: No visible masses     Lungs:   respirations unlabored  Chest Wall:  No tenderness or deformity  Heart:  Regular rate and rhythm,  Abdomen:   Soft, non-tender,         Extremities: RIGHT LONG FINGER MARKED SWELLING FINGER TIP PERFUSED LIMITED DIGITAL MOTION HOLDS IN SEMIFLEXED POSITION TTP OVER FLEXOR SHEATH NO WOUNDS TO INDEX/RING/SMALL  Pulses: 2+ and symmetric  Skin: Skin color, texture, turgor normal, no rashes or lesions     Neurologic: Normal    Assessment/Plan RIGHT LONG FINGER CAT BITE WITH DEEP SPACE INFECTION, CONCERN FOR TENOSYNOVITIS  R/B/A DISCUSSED WITH PT IN OFFICE.  PT VOICED UNDERSTANDING OF PLAN CONSENT SIGNED DAY OF SURGERY PT SEEN AND EXAMINED PRIOR TO OPERATIVE PROCEDURE/DAY OF SURGERY SITE MARKED. QUESTIONS ANSWERED WILL REMAIN OBSERVATION FOLLOWING SURGERY  WE ARE PLANNING SURGERY  FOR YOUR UPPER EXTREMITY. THE RISKS AND BENEFITS OF SURGERY INCLUDE BUT NOT LIMITED TO BLEEDING INFECTION, DAMAGE TO NEARBY NERVES ARTERIES TENDONS, FAILURE OF SURGERY TO ACCOMPLISH ITS INTENDED GOALS, PERSISTENT SYMPTOMS AND NEED FOR FURTHER SURGICAL INTERVENTION. WITH THIS IN MIND WE WILL PROCEED. I HAVE DISCUSSED WITH THE PATIENT THE PRE AND POSTOPERATIVE REGIMEN AND THE DOS AND DON'TS. PT VOICED UNDERSTANDING AND INFORMED CONSENT SIGNED.  Linna Hoff 04/06/2014, 8:05 PM

## 2014-04-06 NOTE — Transfer of Care (Signed)
Immediate Anesthesia Transfer of Care Note  Patient: [name]  Procedure(s) Performed: Procedure(s): INCISION AND DRAINAGE  RIGHT MIDDLE FINGER (Right)  Patient Location: PACU  Anesthesia Type:General  Level of Consciousness: awake, alert , oriented and patient cooperative  Airway & Oxygen Therapy: Patient Spontanous Breathing and Patient connected to nasal cannula oxygen  Post-op Assessment: Report given to PACU RN, Post -op Vital signs reviewed and stable and Patient moving all extremities  Post vital signs: Reviewed and stable  Complications: No apparent anesthesia complications

## 2014-04-06 NOTE — Brief Op Note (Signed)
04/06/2014  8:08 PM  PATIENT:  Bunnie Philipsenise A Follette  49 y.o. female  PRE-OPERATIVE DIAGNOSIS:  MIDDLE FINGER DRAINAGE  POST-OPERATIVE DIAGNOSIS:  * No post-op diagnosis entered *  PROCEDURE:  Procedure(s): INCISION AND DRAINAGE FLEXOR SHEALTH RIGHT MIDDLE FINGER (Right)  SURGEON:  Surgeon(s) and Role:    * Sharma CovertFred W Emerald Gehres, MD - Primary  PHYSICIAN ASSISTANT:   ASSISTANTS: none   ANESTHESIA:   general  EBL:     BLOOD ADMINISTERED:none  DRAINS: none   LOCAL MEDICATIONS USED:  NONE  SPECIMEN:  No Specimen  DISPOSITION OF SPECIMEN:  N/A  COUNTS:  YES  TOURNIQUET:    DICTATION: .Other Dictation: Dictation Number [id number]  PLAN OF CARE: Admit for overnight observation  PATIENT DISPOSITION:  PACU - hemodynamically stable.   Delay start of Pharmacological VTE agent (>24hrs) due to surgical blood loss or risk of bleeding: not applicable

## 2014-04-06 NOTE — Anesthesia Preprocedure Evaluation (Addendum)
Anesthesia Evaluation  Patient identified by MRN, date of birth, ID band Patient awake    Reviewed: Allergy & Precautions, H&P , NPO status , Patient's Chart, lab work & pertinent test results  Airway Mallampati: II  TM Distance: >3 FB Neck ROM: Full    Dental no notable dental hx. (+) Upper Dentures, Lower Dentures, Dental Advisory Given   Pulmonary neg pulmonary ROS, asthma , COPD COPD inhaler, Current Smoker,  breath sounds clear to auscultation  Pulmonary exam normal       Cardiovascular hypertension, Pt. on medications Rhythm:Regular Rate:Normal     Neuro/Psych  Headaches, Anxiety Depression    GI/Hepatic Neg liver ROS, PUD, GERD-  Medicated and Controlled,  Endo/Other  Hypothyroidism   Renal/GU negative Renal ROS  negative genitourinary   Musculoskeletal  (+) Arthritis -, Osteoarthritis,    Abdominal   Peds  Hematology negative hematology ROS (+)   Anesthesia Other Findings   Reproductive/Obstetrics negative OB ROS                            Anesthesia Physical Anesthesia Plan  ASA: III  Anesthesia Plan: General   Post-op Pain Management:    Induction: Intravenous  Airway Management Planned: LMA  Additional Equipment:   Intra-op Plan:   Post-operative Plan: Extubation in OR  Informed Consent: I have reviewed the patients History and Physical, chart, labs and discussed the procedure including the risks, benefits and alternatives for the proposed anesthesia with the patient or authorized representative who has indicated his/her understanding and acceptance.   Dental advisory given  Plan Discussed with: CRNA and Surgeon  Anesthesia Plan Comments:        Anesthesia Quick Evaluation

## 2014-04-07 DIAGNOSIS — M6588 Other synovitis and tenosynovitis, other site: Secondary | ICD-10-CM | POA: Diagnosis not present

## 2014-04-07 MED ORDER — AMOXICILLIN-POT CLAVULANATE 875-125 MG PO TABS: 1.0000 | ORAL_TABLET | Freq: Two times a day (BID) | ORAL | Status: AC

## 2014-04-07 NOTE — Discharge Summary (Signed)
Physician Discharge Summary  Patient ID: [name] MRN: [id number] DOB/AGE: 24-Sep-1965 49 y.o.  Admit date: 04/06/2014 Discharge date: 04/07/2014   Admission Diagnoses: MIDDLE FINGER DRAINAGE Past Medical History  Diagnosis Date  . Hypertension     takes Metoprolol and HCTZ daily  . Hyperlipidemia     takes Pravastatin daily  . Asthma   . COPD (chronic obstructive pulmonary disease)   . Emphysema   . Shortness of breath     lying/sitting/exertion;uses inhaler prn  . Pneumonia     last time a yr ago  . History of bronchitis     last time a yr ago  . Headache(784.0)   . Arthritis   . Joint pain   . Joint swelling   . Chronic back pain     ruptured disc and stenosis  . GERD (gastroesophageal reflux disease)     takes Dexilant daily  . Chronic constipation     takes Linzess every other day  . History of colon polyps   . Ulcerative colitis   . Urinary frequency     takes HCTZ daily  . Urinary urgency   . Hypothyroidism     takes SYnthroid daily  . Dry eyes     eye drops prn  . Anxiety     takes Valium daily  . Bilateral cataracts   . Depression   . PONV (postoperative nausea and vomiting)     only one time 4-5 surgeries ago  . Umbilical hernia     Discharge Diagnoses:  Active Problems:   Cat bite of hand   Surgeries: Procedure(s): INCISION AND DRAINAGE  RIGHT MIDDLE FINGER on 04/06/2014    Consultants:  NONE  Discharged Condition: Improved  Hospital Course: MAESON LOURENCO is an 49 y.o. female who was admitted 04/06/2014 with a chief complaint of No chief complaint on file. , and found to have a diagnosis of MIDDLE FINGER DRAINAGE.  They were brought to the operating room on 04/06/2014 and underwent Procedure(s): INCISION AND DRAINAGE  RIGHT MIDDLE FINGER.    They were given perioperative antibiotics: Anti-infectives    Start     Dose/Rate Route Frequency Ordered Stop   04/07/14 0000  amoxicillin-clavulanate (AUGMENTIN) 875-125 MG per tablet     1  tablet Oral 2 times daily 04/07/14 1056     04/06/14 2215  Ampicillin-Sulbactam (UNASYN) 3 g in sodium chloride 0.9 % 100 mL IVPB     3 g100 mL/hr over 60 Minutes Intravenous To Surgery 04/06/14 2200 04/06/14 2212    .  They were given sequential compression devices, early ambulation,AND AMBULATION for DVT prophylaxis.  Recent vital signs: Patient Vitals for the past 24 hrs:  BP Temp Temp src Pulse Resp SpO2 Height Weight  04/07/14 0448 132/68 mmHg 98.3 F (36.8 C) Oral (!) 105 18 99 % - -  04/07/14 0139 126/70 mmHg 98.1 F (36.7 C) Oral (!) 49 20 99 % - -  04/07/14 0004 133/77 mmHg 98 F (36.7 C) Oral (!) 51 20 99 %  (1.651 m) 76.7 kg (169 lb 1.5 oz)  04/06/14 2345 - 98.3 F (36.8 C) - - - - - -  04/06/14 2315 (!) 146/68 mmHg - - (!) 54 (!) 26 100 % - -  04/06/14 2305 - - - 60 12 98 % - -  04/06/14 2300 139/90 mmHg - - (!) 59 13 100 % - -  04/06/14 2245 - - - 60 (!) 27 95 % - -  04/06/14 2235 117/83 mmHg 97.6 F (36.4 C) - 72 20 100 % - -  .  Recent laboratory studies: Dg Chest 2 View  04/06/2014   CLINICAL DATA:  Preop finger surgery, smoking history.  EXAM: CHEST  2 VIEW  COMPARISON:  04/1912  FINDINGS: The cardiomediastinal contours are normal. The lungs are clear. Pulmonary vasculature is normal. No consolidation, pleural effusion, or pneumothorax. No acute osseous abnormalities are seen. Fusion hardware in the lower cervical spine, partially included. Mild degenerative disc disease in the mid thoracic spine per  IMPRESSION: No acute pulmonary process.   Electronically Signed   By: Rubye OaksMelanie  Ehinger M.D.   On: 04/06/2014 19:19    Discharge Medications:     Medication List    STOP taking these medications        heparin lock flush 100 UNIT/ML Soln injection     rifampin 300 MG capsule  Commonly known as:  RIFADIN     vancomycin 1 GM/200ML Soln  Commonly known as:  VANCOCIN      TAKE these medications        albuterol 108 (90 BASE) MCG/ACT inhaler  Commonly  known as:  PROVENTIL HFA;VENTOLIN HFA  Inhale 2 puffs into the lungs every 6 (six) hours as needed for wheezing or shortness of breath.     albuterol (2.5 MG/3ML) 0.083% nebulizer solution  Commonly known as:  PROVENTIL  Take 2.5 mg by nebulization every 6 (six) hours as needed for wheezing or shortness of breath.     amoxicillin-clavulanate 875-125 MG per tablet  Commonly known as:  AUGMENTIN  Take 1 tablet by mouth 2 (two) times daily.     bisacodyl 5 MG EC tablet  Commonly known as:  DULCOLAX  Take 5 mg by mouth daily as needed (constipation).     budesonide-formoterol 80-4.5 MCG/ACT inhaler  Commonly known as:  SYMBICORT  Inhale 2 puffs into the lungs 2 (two) times daily.     cetirizine 10 MG tablet  Commonly known as:  ZYRTEC  Take 10 mg by mouth daily as needed for allergies.     diazepam 10 MG tablet  Commonly known as:  VALIUM  Take 1 tablet (10 mg total) by mouth 3 (three) times daily.     estradiol 1 MG tablet  Commonly known as:  ESTRACE  Take 1 mg by mouth daily.     fentaNYL 100 MCG/HR  Commonly known as:  DURAGESIC - dosed mcg/hr  Place 1 patch (100 mcg total) onto the skin every other day.     GOODY HEADACHE PO  Take 1 packet by mouth every 4 (four) hours as needed (headache).     levothyroxine 50 MCG tablet  Commonly known as:  SYNTHROID, LEVOTHROID  Take 50 mcg by mouth at bedtime.     methylcellulose 1 % ophthalmic solution  Commonly known as:  ARTIFICIAL TEARS  Place 2 drops into both eyes as needed (eye irritation).     omeprazole 40 MG capsule  Commonly known as:  PRILOSEC  Take 40 mg by mouth 2 (two) times daily.     ondansetron 8 MG disintegrating tablet  Commonly known as:  ZOFRAN-ODT  Take 8 mg by mouth every 8 (eight) hours as needed for nausea or vomiting.     oxyCODONE 15 MG immediate release tablet  Commonly known as:  ROXICODONE  Take 2 tablets (30 mg total) by mouth every 4 (four) hours as needed. As needed for pain.  pravastatin 20 MG tablet  Commonly known as:  PRAVACHOL  Take 20 mg by mouth at bedtime.     traZODone 50 MG tablet  Commonly known as:  DESYREL  Take 50 mg by mouth at bedtime.        Diagnostic Studies: Dg Chest 2 View  04/06/2014   CLINICAL DATA:  Preop finger surgery, smoking history.  EXAM: CHEST  2 VIEW  COMPARISON:  04/1912  FINDINGS: The cardiomediastinal contours are normal. The lungs are clear. Pulmonary vasculature is normal. No consolidation, pleural effusion, or pneumothorax. No acute osseous abnormalities are seen. Fusion hardware in the lower cervical spine, partially included. Mild degenerative disc disease in the mid thoracic spine per  IMPRESSION: No acute pulmonary process.   Electronically Signed   By: Rubye Oaks M.D.   On: 04/06/2014 19:19    They benefited maximally from their hospital stay and there were no complications.     Disposition: 01-Home or Self Care   CARE DISCUSSED WITH PATIENT, PT WANTS TO LEAVE TODAY PT AGREED WITH PLAN TO LEAVE SPLINT ON AT ALL TIMES AND F/U CLOSELY IN OFFICE  Signed: Sharma Covert 04/07/2014, 10:56 AM

## 2014-04-07 NOTE — Progress Notes (Signed)
Patient discharged home per order.Dc instructions given and patient verbalized understanding.

## 2014-04-07 NOTE — Anesthesia Postprocedure Evaluation (Signed)
  Anesthesia Post-op Note  Patient: [name]  Procedure(s) Performed: Procedure(s): INCISION AND DRAINAGE  RIGHT MIDDLE FINGER (Right)  Patient Location: PACU  Anesthesia Type:General  Level of Consciousness: awake and alert   Airway and Oxygen Therapy: Patient Spontanous Breathing  Post-op Pain: severe  Post-op Assessment: Post-op Vital signs reviewed, Patient's Cardiovascular Status Stable and Respiratory Function Stable  Post-op Vital Signs: Reviewed  Filed Vitals:   04/07/14 0448  BP: 132/68  Pulse: 105  Temp: 36.8 C  Resp: 18    Complications: No apparent anesthesia complications

## 2014-04-07 NOTE — Op Note (Signed)
[name], [name] NO.:  [id number]  MEDICAL RECORD NO.:  [id number]  LOCATION:  [id number]                        FACILITY:  [facility]  PHYSICIAN:  Madelynn Done, MD  DATE OF BIRTH:  1965-09-03  DATE OF PROCEDURE:  04/06/2014 DATE OF DISCHARGE:                              OPERATIVE REPORT   PREOPERATIVE DIAGNOSIS:  Right long finger flexor sheath infection Early tenosynovitis.  POSTOPERATIVE DIAGNOSIS:  Right long finger flexor sheath infection Early tenosynovitis.  ATTENDING PHYSICIAN:  Madelynn Done, MD, who scrubbed and present for the entire procedure.  ASSISTANT SURGEON:  None.  ANESTHESIA:  General via LMA.  SURGICAL PROCEDURES: 1. Right long finger flexor sheath drainage. 2. Right long finger excisional debridement of skin, subcutaneous     tissues associated with necrotic tissue. 3. Right long finger radial digital nerve and right long finger ulnar     digital nerve neuroplasty.  SURGICAL INDICATIONS:  [name] is a right-hand dominant female, who presented to the office with a worsening infection to the right long finger.  The patient was seen and evaluated in the office and recommended to undergo urgent incision and drainage.  Risks, benefits, and alternatives were discussed in detail with the patient and signed informed consent was obtained.  Risks include, but not limited to bleeding, infection, damage to nearby nerves, arteries or tendons, loss of motion of wrist and digits, incomplete relief of symptoms, and need for further surgical intervention.  DESCRIPTION OF PROCEDURE:  The patient was properly identified in the preoperative holding area and marked with a permanent marker made on the right long finger to indicate the correct operative site.  The patient was brought back to the operating room, placed supine on anesthesia table.  The general anesthetic was administered.  The patient tolerated this well.  A well-padded  tourniquet was placed on the right brachium and sealed with 1000 drape.  The right upper extremity was then prepped and draped in normal sterile fashion.  Time-out was called.  Correct site was identified, and procedure then begun.  Attention then turned to the right long finger.  Bruner incision was then made directly over the long finger over the middle phalanx.  The limb was then elevated and tourniquet insufflated.  Dissection was carried down through skin and subcutaneous tissue.  Gross purulence was encountered and subcutaneous tissue was extending down in the flexor sheath.  An excisional debridement of the skin and subcutaneous tissue was then carried out sharply with a knife and rongeur and curettes.  After excision of the necrotic tissue, the flexor sheath was then exposed.  Thorough irrigation was then carried out of the flexor sheath.  Careful protection of the ulnar digital nerve was then done given where the bite was directly over the ulnar digital nerve.  Careful dissection of the ulnar digital nerve was then carried out.  Neuroplasty was then done of the ulnar digital nerve.  The wound was in the flexor sheath, has been thoroughly irrigated after thorough wound irrigation and the flexor sheath.  The skin was then loosely reapproximated with 4-0 Prolene sutures.  Adaptic dressing and sterile compressive bandage were then applied.  The  patient tolerated the procedure well, returned to the recovery room in good condition.  POSTPROCEDURE PLAN:  The patient will be admitted overnight for IV antibiotics and pain control.  I will follow her closely, IV antibiotics for at least 48 hours and wound check in 48 hours and then likely home after wound cultures were taken.  Intraoperative wound cultures, aerobic and anaerobic cultures taken.     Madelynn DoneFred W Deejay Graves IV, MD     FWO/MEDQ  D:  04/06/2014  T:  04/07/2014  Job:  [id number]

## 2014-04-07 NOTE — Discharge Instructions (Signed)
KEEP BANDAGE CLEAN AND DRY CALL OFFICE FOR F/U APPT [phone] on Tuesday 04/10/2014 KEEP HAND ELEVATED ABOVE HEART OK TO APPLY ICE TO OPERATIVE AREA CONTACT OFFICE IF ANY WORSENING PAIN OR CONCERNS.

## 2014-04-09 ENCOUNTER — Encounter (HOSPITAL_COMMUNITY): Payer: Self-pay | Admitting: Orthopedic Surgery

## 2014-04-10 LAB — CULTURE, ROUTINE-ABSCESS: GRAM STAIN: NONE SEEN

## 2014-04-11 LAB — ANAEROBIC CULTURE: Gram Stain: NONE SEEN

## 2014-05-18 IMAGING — RF DG MYELOGRAM LUMBAR
12 of 17 series · 12 of 17 positions shown · non-contrast
Comparison: none

CLINICAL DATA: Back pain.  Status post L5-S1 fusion.
TECHNIQUE: Contiguous axial images were obtained through the lumbar
spine without infusion. Coronal, sagittal, and disc space
reconstructions were obtained of the axial image sets.

[Series 2: (hospital) · 1 of 1 slices shown]
[im 1/1]
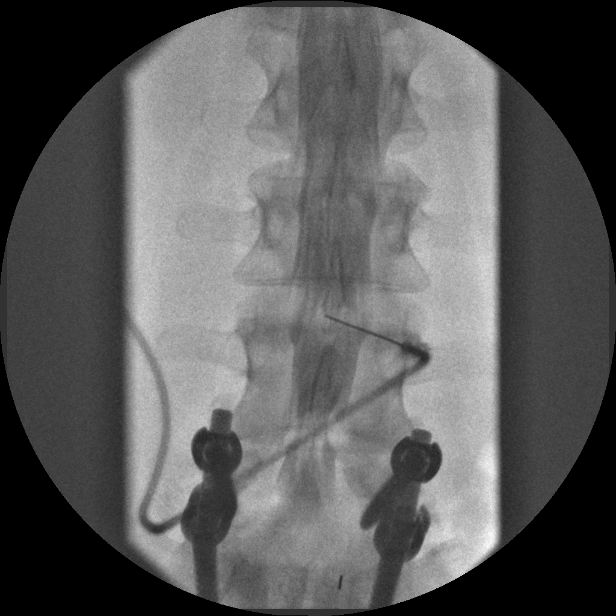

[Series 4: myelogram  white · 1 of 1 slices shown (1 of 11)]
[im 1/1]
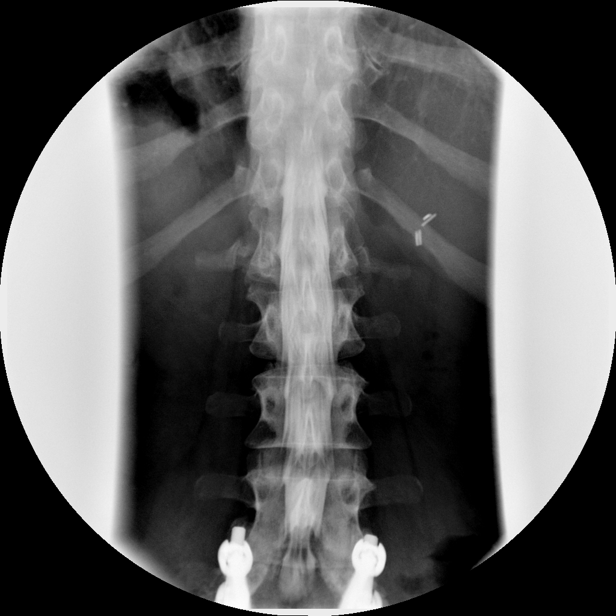

[Series 5: myelogram  white · 1 of 1 slices shown (2 of 11)]
[im 1/1]
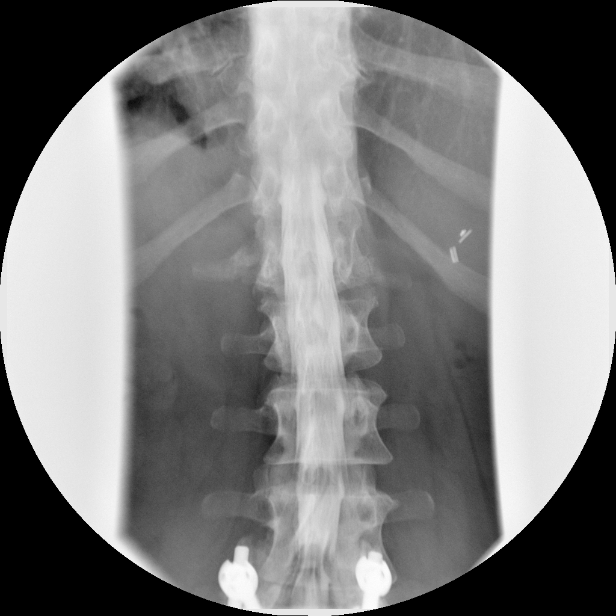

[Series 6: myelogram  white · 1 of 1 slices shown (3 of 11)]
[im 1/1]
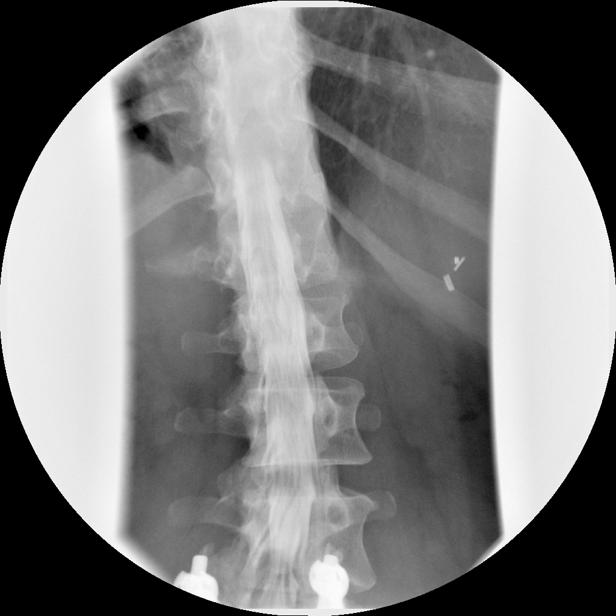

[Series 8: myelogram  white · 1 of 1 slices shown (4 of 11)]
[im 1/1]
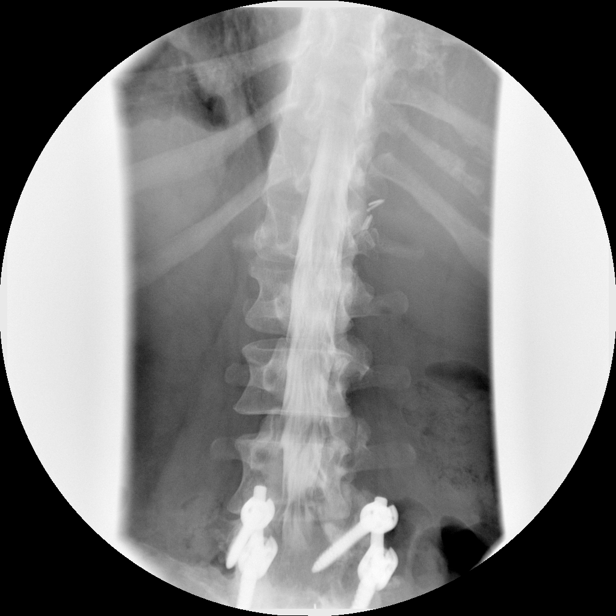

[Series 9: myelogram  white · 1 of 1 slices shown (5 of 11)]
[im 1/1]
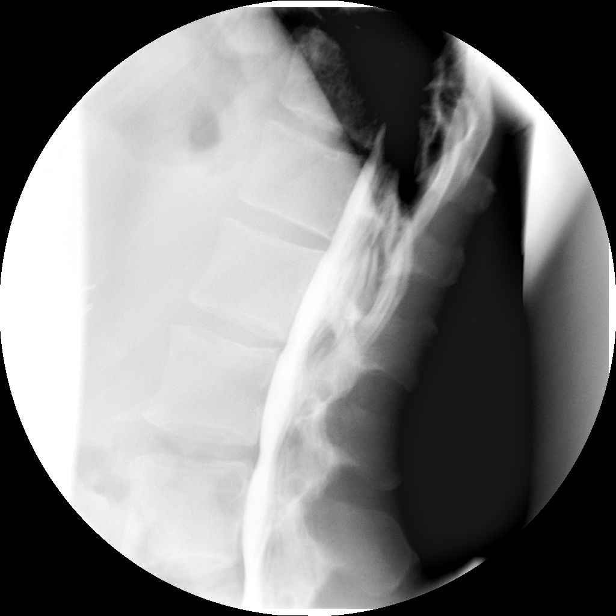

[Series 11: myelogram  white · 1 of 1 slices shown (6 of 11)]
[im 1/1]
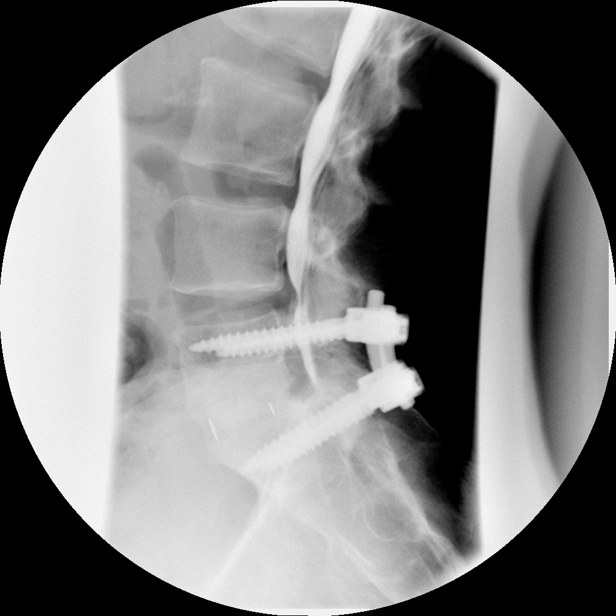

[Series 12: myelogram  white · 1 of 1 slices shown (7 of 11)]
[im 1/1]
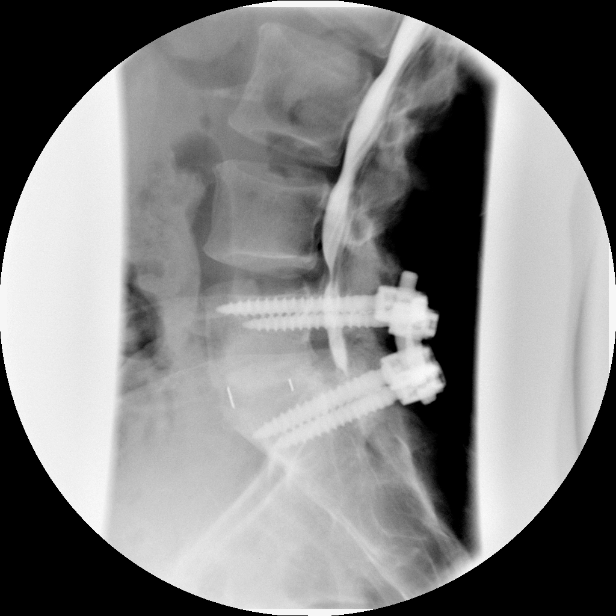

[Series 14: myelogram  white · 1 of 1 slices shown (8 of 11)]
[im 1/1]
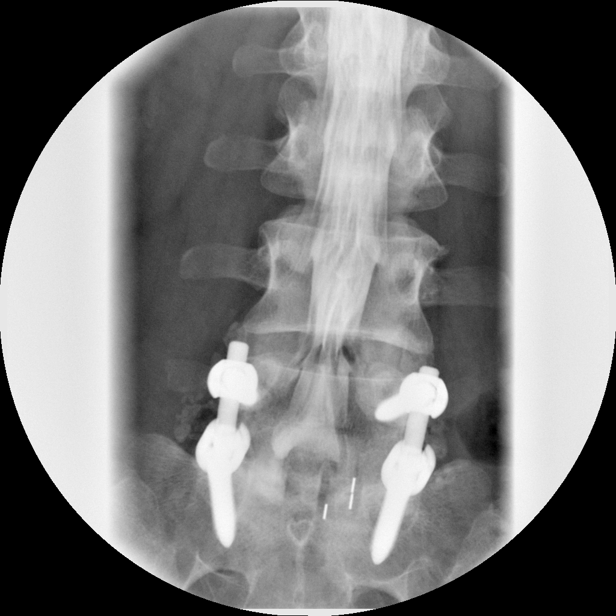

[Series 15: myelogram  white · 1 of 1 slices shown (9 of 11)]
[im 1/1]
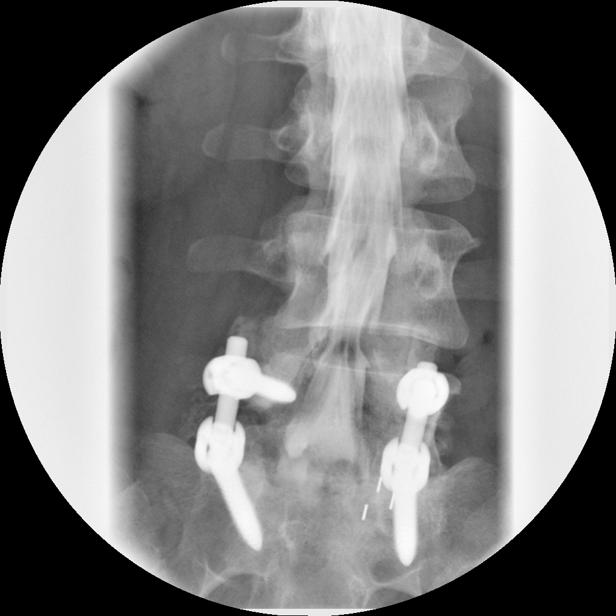

[Series 16: myelogram  white · 1 of 1 slices shown (10 of 11)]
[im 1/1]
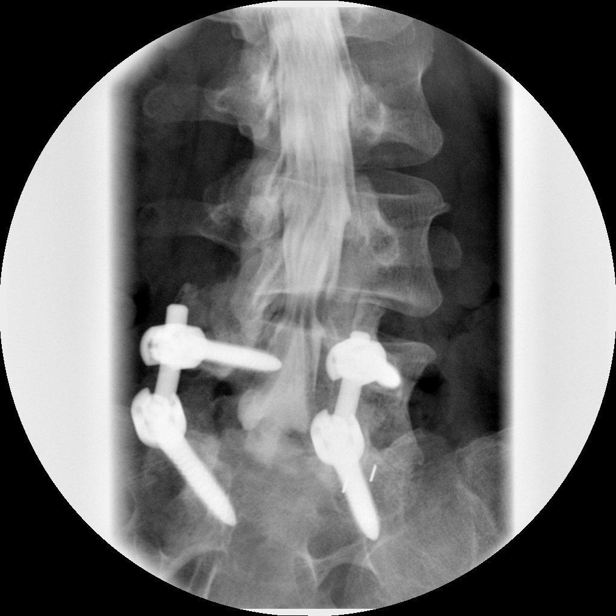

[Series 18: myelogram  white · 1 of 1 slices shown (11 of 11)]
[im 1/1]
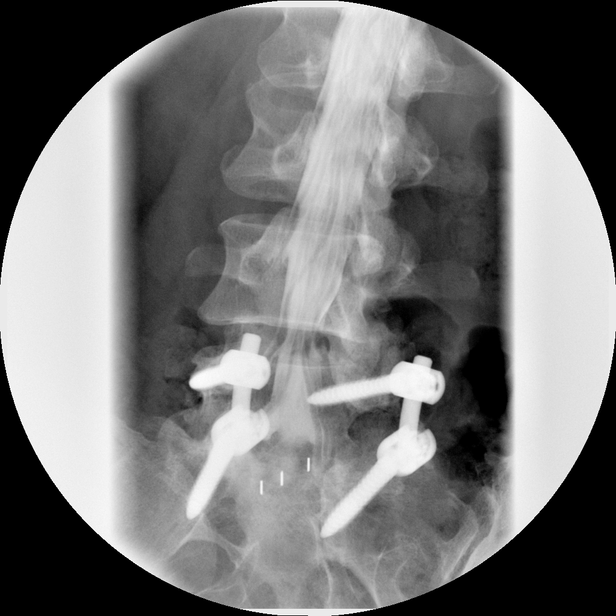

[12 of 17 positions shown; findings below may reference images not displayed]

LUMBAR MYELOGRAM

Procedure: After thorough discussion of risks and benefits of the
procedure including bleeding, infection, injury to nerves, blood
vessels, adjacent structures as well as headache and CSF leak,
written and oral informed consent was obtained.   Consent was
obtained by Dr.Cheick Amed.

Patient was positioned prone on the fluoroscopy table. Local
anesthesia was provided with 1% lidocaine without epinephrine after
prepped and draped in the usual sterile fashion. Puncture was
performed at L3-L4 using a 3-1/2 inches 22-gauge spinal needle via
right paramedian approach.  Using a single pass through the dura,
the needle was placed within the thecal sac, with return of clear
CSF. 15 mL of 1mnipaque-KZW was injected into the thecal sac, with
normal opacification of the nerve roots and cauda equina consistent
with free flow within the subarachnoid space.

Fluoroscopy time: 35 seconds.

I personally performed the lumbar puncture and administered the
intrathecal contrast. I also personally supervised acquisition of
the myelogram images.
FINDINGS: Bilateral rod and pedicle screw fixation is present at
L5-S1 with L5-S1 discectomy.  No plain film evidence of hardware
loosening.  Fusion at the L5-S1 disc space appears solid.  Adjacent
segment disease is present at L4-L5 with mild to moderate central
stenosis.  Transverse narrowing of the thecal sac, however with
medial displacement of the descending L5 nerve roots bilaterally,
right greater than left.  Shallow L3-L4 disc bulge.  Minimal L2-L3
disc bulging.  There is no pathologic motion with flexion and
extension.  There is no spondylolisthesis in the neutral position
or with flexion and extension.  There is no motion at L5-S1. On the
upright frontal radiograph, there is a mild dextroconvex curvature
with the apex at L3-L4.
IMPRESSION: 1.  Successful lumbar puncture for lumbar myelogram at L3-L4.
2.  L4-L5 adjacent segment disease with mild to moderate central
stenosis and bilateral lateral recess stenosis.
3.  Shallow disc bulging at L3-L4 without significant stenosis.

CT LUMBAR MYELOGRAM
FINDINGS: Vestigial L1 ribs are present.  Numbering convention used
on prior exam preserved. Spinal cord terminates posterior to the L2
vertebra.  The paraspinal soft tissues show level no significant
abnormality.  Right greater than left bilateral SI joint
degenerative changes.

T12-L1 and L1-L2 levels are normal.  L2-L3 shows minimal disc
bulging without stenosis.

L3-L4:  Shallow disc bulging.  The foramina appear patent.  Minimal
central stenosis associated with bulging disc and facet hypertrophy
with ligamentum flavum redundancy.  Lateral recesses patent.

L4-L5:  Moderate central stenosis is multifactorial.  Facet
arthrosis, posterior ligamentum flavum redundancy and broad-based
posterior disc bulging is present.  Bilateral lateral recess
stenosis is present potentially affecting both descending L5 nerve
roots.  Moderate right and mild left foraminal stenosis associated
with bulging disc and facet hypertrophy.  This potentially affects
both exiting L4 nerves.

L5-S1:  Solid fusion with bridging bone across the disc space.
There is no hardware complication.  Incorporation of interbody bone
graft.  Lateral position of the left L5 pedicle screw, breaching
the lateral cortex of the L5 vertebra.  No foraminal intrusion by
hardware.  Central canal is decompressed.  Lateral recesses appear
patent.

When comparing today's myelogram to the previous myelogram of
11/08/2008, the central canal narrowing has increased, with more
prominent transverse narrowing of the thecal sac.
IMPRESSION: 1.  L4-L5 progressive adjacent segment disease with moderate
central stenosis.  Transverse narrowing of the thecal sac and
bilateral lateral recess stenosis potentially affecting the
descending L5 nerve roots.  Moderate right and mild left foraminal
stenosis potentially affecting the L4 nerves.
2.  Solid L5-S1 fusion.  No hardware complication.  No recurrent
stenosis.

## 2014-05-18 IMAGING — CT CT L SPINE W/ CM
3 of 11 series · 6 of 20 positions shown, 7 images · non-contrast
Comparison: none

CLINICAL DATA: Back pain.  Status post L5-S1 fusion.
TECHNIQUE: Contiguous axial images were obtained through the lumbar
spine without infusion. Coronal, sagittal, and disc space
reconstructions were obtained of the axial image sets.

[Series 2: l spine bone · axial · 0.27mm/px · z∈[-221,+24]mm · 3 of 99 slices shown, 4 images]
[im 1/99  soft-tissue]
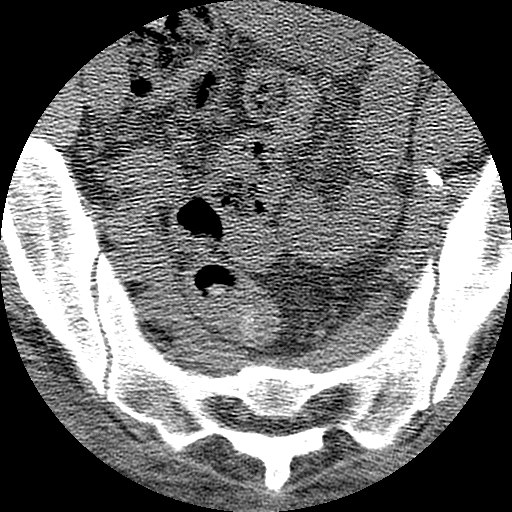
[im 1/99  bone]
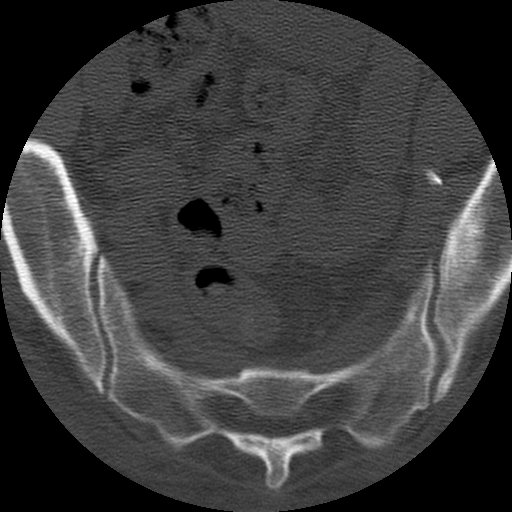
[im 50/99  bone]
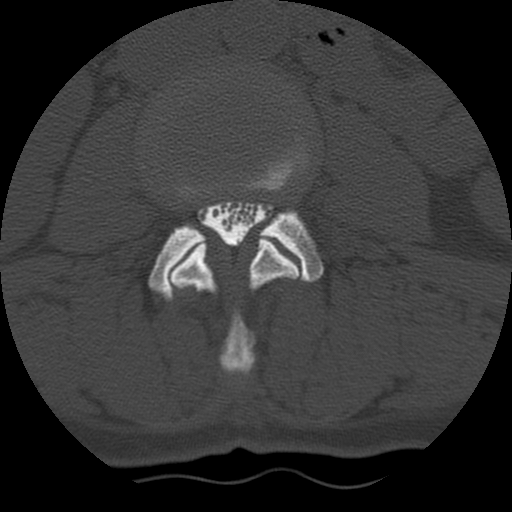
[im 99/99  bone]
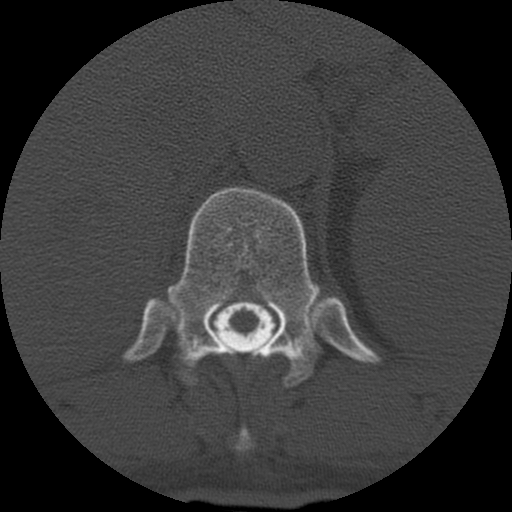

[Series 3: l spine soft · axial · 0.27mm/px · z∈[-141,-58]mm · 2 of 99 slices shown]
[im 33/99  soft-tissue]
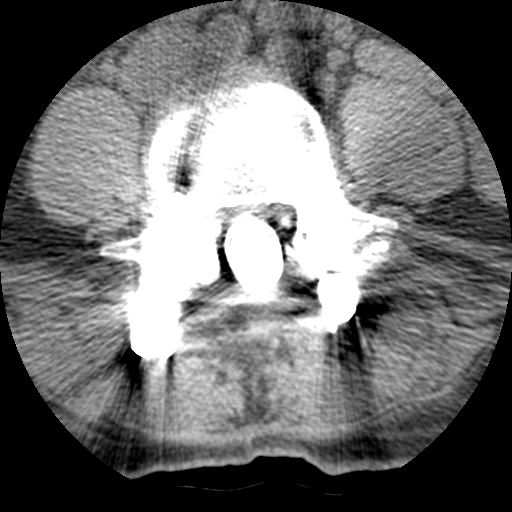
[im 66/99  soft-tissue]
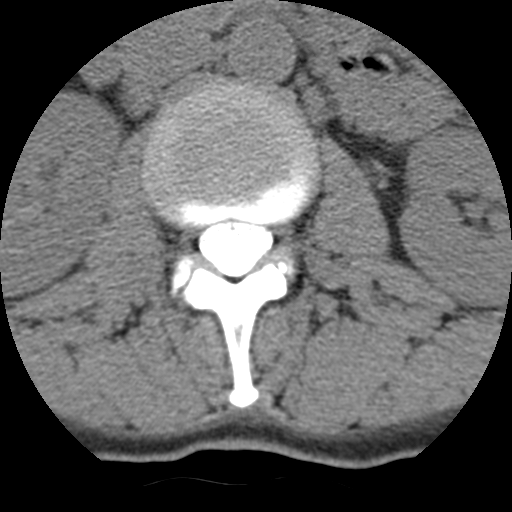

[Series 400: cor upper · coronal · 0.49mm/px · 1 of 53 slices shown]
[im 49/53  bone]
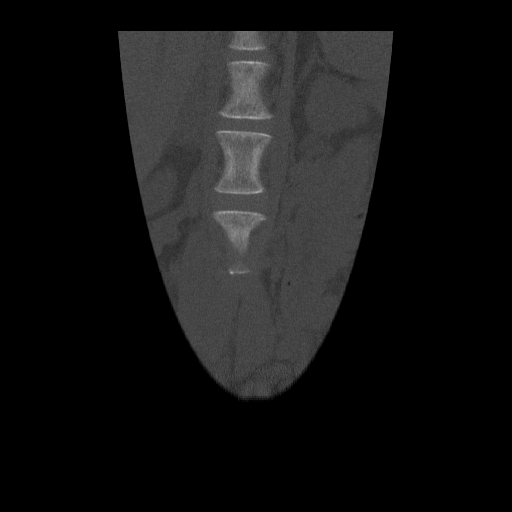

[6 of 20 positions shown; findings below may reference images not displayed]

LUMBAR MYELOGRAM

Procedure: After thorough discussion of risks and benefits of the
procedure including bleeding, infection, injury to nerves, blood
vessels, adjacent structures as well as headache and CSF leak,
written and oral informed consent was obtained.   Consent was
obtained by Dr.Cheick Amed.

Patient was positioned prone on the fluoroscopy table. Local
anesthesia was provided with 1% lidocaine without epinephrine after
prepped and draped in the usual sterile fashion. Puncture was
performed at L3-L4 using a 3-1/2 inches 22-gauge spinal needle via
right paramedian approach.  Using a single pass through the dura,
the needle was placed within the thecal sac, with return of clear
CSF. 15 mL of 1mnipaque-KZW was injected into the thecal sac, with
normal opacification of the nerve roots and cauda equina consistent
with free flow within the subarachnoid space.

Fluoroscopy time: 35 seconds.

I personally performed the lumbar puncture and administered the
intrathecal contrast. I also personally supervised acquisition of
the myelogram images.
FINDINGS: Bilateral rod and pedicle screw fixation is present at
L5-S1 with L5-S1 discectomy.  No plain film evidence of hardware
loosening.  Fusion at the L5-S1 disc space appears solid.  Adjacent
segment disease is present at L4-L5 with mild to moderate central
stenosis.  Transverse narrowing of the thecal sac, however with
medial displacement of the descending L5 nerve roots bilaterally,
right greater than left.  Shallow L3-L4 disc bulge.  Minimal L2-L3
disc bulging.  There is no pathologic motion with flexion and
extension.  There is no spondylolisthesis in the neutral position
or with flexion and extension.  There is no motion at L5-S1. On the
upright frontal radiograph, there is a mild dextroconvex curvature
with the apex at L3-L4.
IMPRESSION: 1.  Successful lumbar puncture for lumbar myelogram at L3-L4.
2.  L4-L5 adjacent segment disease with mild to moderate central
stenosis and bilateral lateral recess stenosis.
3.  Shallow disc bulging at L3-L4 without significant stenosis.

CT LUMBAR MYELOGRAM
FINDINGS: Vestigial L1 ribs are present.  Numbering convention used
on prior exam preserved. Spinal cord terminates posterior to the L2
vertebra.  The paraspinal soft tissues show level no significant
abnormality.  Right greater than left bilateral SI joint
degenerative changes.

T12-L1 and L1-L2 levels are normal.  L2-L3 shows minimal disc
bulging without stenosis.

L3-L4:  Shallow disc bulging.  The foramina appear patent.  Minimal
central stenosis associated with bulging disc and facet hypertrophy
with ligamentum flavum redundancy.  Lateral recesses patent.

L4-L5:  Moderate central stenosis is multifactorial.  Facet
arthrosis, posterior ligamentum flavum redundancy and broad-based
posterior disc bulging is present.  Bilateral lateral recess
stenosis is present potentially affecting both descending L5 nerve
roots.  Moderate right and mild left foraminal stenosis associated
with bulging disc and facet hypertrophy.  This potentially affects
both exiting L4 nerves.

L5-S1:  Solid fusion with bridging bone across the disc space.
There is no hardware complication.  Incorporation of interbody bone
graft.  Lateral position of the left L5 pedicle screw, breaching
the lateral cortex of the L5 vertebra.  No foraminal intrusion by
hardware.  Central canal is decompressed.  Lateral recesses appear
patent.

When comparing today's myelogram to the previous myelogram of
11/08/2008, the central canal narrowing has increased, with more
prominent transverse narrowing of the thecal sac.
IMPRESSION: 1.  L4-L5 progressive adjacent segment disease with moderate
central stenosis.  Transverse narrowing of the thecal sac and
bilateral lateral recess stenosis potentially affecting the
descending L5 nerve roots.  Moderate right and mild left foraminal
stenosis potentially affecting the L4 nerves.
2.  Solid L5-S1 fusion.  No hardware complication.  No recurrent
stenosis.

## 2021-05-21 DEATH — deceased
